# Patient Record
Sex: Female | Born: 1990 | Race: Black or African American | Hispanic: No | Marital: Single | Smoking: Former smoker
Health system: Southern US, Community
[De-identification: ages and names within clinical notes are randomized; demographics above are authoritative.]

## PROBLEM LIST (undated history)

## (undated) DIAGNOSIS — G35 Multiple sclerosis: Secondary | ICD-10-CM

## (undated) DIAGNOSIS — G35D Multiple sclerosis, unspecified: Secondary | ICD-10-CM

## (undated) DIAGNOSIS — F419 Anxiety disorder, unspecified: Secondary | ICD-10-CM

## (undated) DIAGNOSIS — F32A Depression, unspecified: Secondary | ICD-10-CM

## (undated) DIAGNOSIS — B009 Herpesviral infection, unspecified: Secondary | ICD-10-CM

## (undated) HISTORY — PX: APPENDECTOMY: SHX54

## (undated) HISTORY — DX: Anxiety disorder, unspecified: F41.9

## (undated) HISTORY — DX: Depression, unspecified: F32.A

## (undated) HISTORY — DX: Multiple sclerosis: G35

## (undated) HISTORY — DX: Multiple sclerosis, unspecified: G35.D

## (undated) HISTORY — PX: DILATION AND CURETTAGE OF UTERUS: SHX78

---

## 2020-04-27 ENCOUNTER — Inpatient Hospital Stay (HOSPITAL_COMMUNITY)
Admission: EM | Admit: 2020-04-27 | Discharge: 2020-04-30 | DRG: 342 | Disposition: A | Discharge status: Home / Self Care | Payer: No Typology Code available for payment source | Attending: Surgery | Admitting: Surgery

## 2020-04-27 DIAGNOSIS — K651 Peritoneal abscess: Secondary | ICD-10-CM

## 2020-04-27 DIAGNOSIS — K352 Acute appendicitis with generalized peritonitis, without abscess: Secondary | ICD-10-CM | POA: Diagnosis not present

## 2020-04-27 DIAGNOSIS — Z7289 Other problems related to lifestyle: Secondary | ICD-10-CM

## 2020-04-27 DIAGNOSIS — F17210 Nicotine dependence, cigarettes, uncomplicated: Secondary | ICD-10-CM | POA: Diagnosis present

## 2020-04-27 DIAGNOSIS — E860 Dehydration: Secondary | ICD-10-CM | POA: Diagnosis present

## 2020-04-27 DIAGNOSIS — Z20822 Contact with and (suspected) exposure to covid-19: Secondary | ICD-10-CM | POA: Diagnosis present

## 2020-04-27 DIAGNOSIS — N179 Acute kidney failure, unspecified: Secondary | ICD-10-CM | POA: Diagnosis not present

## 2020-04-27 DIAGNOSIS — N739 Female pelvic inflammatory disease, unspecified: Secondary | ICD-10-CM | POA: Diagnosis present

## 2020-04-27 DIAGNOSIS — D62 Acute posthemorrhagic anemia: Secondary | ICD-10-CM | POA: Diagnosis not present

## 2020-04-27 DIAGNOSIS — R1031 Right lower quadrant pain: Secondary | ICD-10-CM

## 2020-04-27 DIAGNOSIS — N7093 Salpingitis and oophoritis, unspecified: Secondary | ICD-10-CM | POA: Diagnosis present

## 2020-04-27 NOTE — ED Triage Notes (Signed)
Pt c/o 10/10 lower abd pain since Friday, denies n/v/d. No fever or chills.

## 2020-04-28 ENCOUNTER — Emergency Department (HOSPITAL_COMMUNITY): Payer: No Typology Code available for payment source

## 2020-04-28 ENCOUNTER — Other Ambulatory Visit: Payer: Self-pay

## 2020-04-28 ENCOUNTER — Encounter (HOSPITAL_COMMUNITY): Admission: EM | Disposition: A | Discharge status: Home / Self Care | Payer: Self-pay | Source: Home / Self Care

## 2020-04-28 ENCOUNTER — Encounter (HOSPITAL_COMMUNITY): Payer: Self-pay | Admitting: Emergency Medicine

## 2020-04-28 ENCOUNTER — Emergency Department (HOSPITAL_COMMUNITY): Payer: No Typology Code available for payment source | Admitting: Certified Registered Nurse Anesthetist

## 2020-04-28 DIAGNOSIS — N7093 Salpingitis and oophoritis, unspecified: Secondary | ICD-10-CM | POA: Diagnosis present

## 2020-04-28 DIAGNOSIS — N179 Acute kidney failure, unspecified: Secondary | ICD-10-CM | POA: Diagnosis present

## 2020-04-28 DIAGNOSIS — Z7289 Other problems related to lifestyle: Secondary | ICD-10-CM | POA: Diagnosis not present

## 2020-04-28 DIAGNOSIS — E860 Dehydration: Secondary | ICD-10-CM | POA: Diagnosis present

## 2020-04-28 DIAGNOSIS — Z20822 Contact with and (suspected) exposure to covid-19: Secondary | ICD-10-CM | POA: Diagnosis present

## 2020-04-28 DIAGNOSIS — K352 Acute appendicitis with generalized peritonitis, without abscess: Secondary | ICD-10-CM | POA: Diagnosis present

## 2020-04-28 DIAGNOSIS — D62 Acute posthemorrhagic anemia: Secondary | ICD-10-CM | POA: Diagnosis not present

## 2020-04-28 DIAGNOSIS — N739 Female pelvic inflammatory disease, unspecified: Secondary | ICD-10-CM | POA: Diagnosis present

## 2020-04-28 DIAGNOSIS — F17210 Nicotine dependence, cigarettes, uncomplicated: Secondary | ICD-10-CM | POA: Diagnosis present

## 2020-04-28 DIAGNOSIS — K651 Peritoneal abscess: Secondary | ICD-10-CM | POA: Diagnosis present

## 2020-04-28 HISTORY — PX: LAPAROSCOPIC APPENDECTOMY: SHX408

## 2020-04-28 LAB — COMPREHENSIVE METABOLIC PANEL
ALT: 8 U/L (ref 0–44)
AST: 13 U/L — ABNORMAL LOW (ref 15–41)
Albumin: 3.2 g/dL — ABNORMAL LOW (ref 3.5–5.0)
Alkaline Phosphatase: 63 U/L (ref 38–126)
Anion gap: 19 — ABNORMAL HIGH (ref 5–15)
BUN: 17 mg/dL (ref 6–20)
CO2: 15 mmol/L — ABNORMAL LOW (ref 22–32)
Calcium: 8.9 mg/dL (ref 8.9–10.3)
Chloride: 102 mmol/L (ref 98–111)
Creatinine, Ser: 3.13 mg/dL — ABNORMAL HIGH (ref 0.44–1.00)
GFR calc Af Amer: 22 mL/min — ABNORMAL LOW (ref >60)
GFR calc non Af Amer: 19 mL/min — ABNORMAL LOW (ref >60)
Glucose, Bld: 118 mg/dL — ABNORMAL HIGH (ref 70–99)
Potassium: 3.6 mmol/L (ref 3.5–5.1)
Sodium: 136 mmol/L (ref 135–145)
Total Bilirubin: 0.2 mg/dL — ABNORMAL LOW (ref 0.3–1.2)
Total Protein: 7.7 g/dL (ref 6.5–8.1)

## 2020-04-28 LAB — CBC
HCT: 37.4 % (ref 36.0–46.0)
Hemoglobin: 13.3 g/dL (ref 12.0–15.0)
MCH: 30.9 pg (ref 26.0–34.0)
MCHC: 35.6 g/dL (ref 30.0–36.0)
MCV: 87 fL (ref 80.0–100.0)
Platelets: 320 10*3/uL (ref 150–400)
RBC: 4.3 MIL/uL (ref 3.87–5.11)
RDW: 14.4 % (ref 11.5–15.5)
WBC: 26.3 10*3/uL — ABNORMAL HIGH (ref 4.0–10.5)
nRBC: 0 % (ref 0.0–0.2)

## 2020-04-28 LAB — URINALYSIS, ROUTINE W REFLEX MICROSCOPIC
Bilirubin Urine: NEGATIVE
Glucose, UA: 50 mg/dL — AB
Ketones, ur: 5 mg/dL — AB
Leukocytes,Ua: NEGATIVE
Nitrite: POSITIVE — AB
Protein, ur: 100 mg/dL — AB
Specific Gravity, Urine: 1.01 (ref 1.005–1.030)
pH: 5 (ref 5.0–8.0)

## 2020-04-28 LAB — POC SARS CORONAVIRUS 2 AG -  ED: SARS Coronavirus 2 Ag: NEGATIVE

## 2020-04-28 LAB — LIPASE, BLOOD: Lipase: 16 U/L (ref 11–51)

## 2020-04-28 LAB — I-STAT BETA HCG BLOOD, ED (MC, WL, AP ONLY): I-stat hCG, quantitative: 6.3 m[IU]/mL — ABNORMAL HIGH (ref <5)

## 2020-04-28 SURGERY — APPENDECTOMY, LAPAROSCOPIC
Anesthesia: General | Site: Abdomen

## 2020-04-28 MED ORDER — 0.9 % SODIUM CHLORIDE (POUR BTL) OPTIME
TOPICAL | Status: DC | PRN
  Administered 2020-04-28: 1000 mL

## 2020-04-28 MED ORDER — DIPHENHYDRAMINE HCL 50 MG/ML IJ SOLN
INTRAMUSCULAR | Status: DC | PRN
Start: 2020-04-28 — End: 2020-04-28
  Administered 2020-04-28: 12.5 mg via INTRAVENOUS

## 2020-04-28 MED ORDER — MIDAZOLAM HCL 2 MG/2ML IJ SOLN
INTRAMUSCULAR | Status: AC
  Filled 2020-04-28: qty 2

## 2020-04-28 MED ORDER — OXYCODONE HCL 5 MG PO TABS
5.0000 mg | ORAL_TABLET | ORAL | Status: DC | PRN
  Administered 2020-04-28: 10 mg via ORAL
  Administered 2020-04-29: 5 mg via ORAL
  Administered 2020-04-29 – 2020-04-30 (×3): 10 mg via ORAL
  Filled 2020-04-28 (×3): qty 2
  Filled 2020-04-28: qty 1
  Filled 2020-04-28: qty 2

## 2020-04-28 MED ORDER — HYDROMORPHONE HCL 1 MG/ML IJ SOLN
0.5000 mg | Freq: Once | INTRAMUSCULAR | Status: AC
  Administered 2020-04-28: 0.5 mg via INTRAVENOUS
  Filled 2020-04-28: qty 1

## 2020-04-28 MED ORDER — FENTANYL CITRATE (PF) 250 MCG/5ML IJ SOLN
INTRAMUSCULAR | Status: AC
  Filled 2020-04-28: qty 5

## 2020-04-28 MED ORDER — SODIUM CHLORIDE 0.9% FLUSH: 3.0000 mL | Freq: Once | INTRAVENOUS | Status: DC

## 2020-04-28 MED ORDER — PHENYLEPHRINE 40 MCG/ML (10ML) SYRINGE FOR IV PUSH (FOR BLOOD PRESSURE SUPPORT)
PREFILLED_SYRINGE | INTRAVENOUS | Status: DC | PRN
  Administered 2020-04-28: 80 ug via INTRAVENOUS

## 2020-04-28 MED ORDER — METOCLOPRAMIDE HCL 5 MG/ML IJ SOLN
INTRAMUSCULAR | Status: AC
  Filled 2020-04-28: qty 2

## 2020-04-28 MED ORDER — PHENYLEPHRINE 40 MCG/ML (10ML) SYRINGE FOR IV PUSH (FOR BLOOD PRESSURE SUPPORT)
PREFILLED_SYRINGE | INTRAVENOUS | Status: AC
  Filled 2020-04-28: qty 10

## 2020-04-28 MED ORDER — SUGAMMADEX SODIUM 200 MG/2ML IV SOLN
INTRAVENOUS | Status: DC | PRN
  Administered 2020-04-28: 200 mg via INTRAVENOUS

## 2020-04-28 MED ORDER — ROCURONIUM BROMIDE 10 MG/ML (PF) SYRINGE
PREFILLED_SYRINGE | INTRAVENOUS | Status: DC | PRN
  Administered 2020-04-28: 50 mg via INTRAVENOUS

## 2020-04-28 MED ORDER — SODIUM CHLORIDE 0.9 % IV SOLN: INTRAVENOUS | Status: DC

## 2020-04-28 MED ORDER — ACETAMINOPHEN 325 MG PO TABS: 650.0000 mg | ORAL_TABLET | Freq: Four times a day (QID) | ORAL | Status: DC | PRN

## 2020-04-28 MED ORDER — DEXAMETHASONE SODIUM PHOSPHATE 10 MG/ML IJ SOLN
INTRAMUSCULAR | Status: DC | PRN
  Administered 2020-04-28: 10 mg via INTRAVENOUS

## 2020-04-28 MED ORDER — PROMETHAZINE HCL 25 MG/ML IJ SOLN: 6.2500 mg | INTRAMUSCULAR | Status: DC | PRN

## 2020-04-28 MED ORDER — DIPHENHYDRAMINE HCL 50 MG/ML IJ SOLN: 25.0000 mg | Freq: Four times a day (QID) | INTRAMUSCULAR | Status: DC | PRN

## 2020-04-28 MED ORDER — LACTATED RINGERS IV BOLUS
1000.0000 mL | Freq: Once | INTRAVENOUS | Status: AC
  Administered 2020-04-28: 1000 mL via INTRAVENOUS

## 2020-04-28 MED ORDER — DIPHENHYDRAMINE HCL 50 MG/ML IJ SOLN
INTRAMUSCULAR | Status: AC
  Filled 2020-04-28: qty 1

## 2020-04-28 MED ORDER — PHENYLEPHRINE HCL-NACL 10-0.9 MG/250ML-% IV SOLN
INTRAVENOUS | Status: DC | PRN
  Administered 2020-04-28: 25 ug/min via INTRAVENOUS

## 2020-04-28 MED ORDER — GABAPENTIN 300 MG PO CAPS
300.0000 mg | ORAL_CAPSULE | Freq: Two times a day (BID) | ORAL | Status: DC
  Administered 2020-04-28 – 2020-04-30 (×5): 300 mg via ORAL
  Filled 2020-04-28 (×5): qty 1

## 2020-04-28 MED ORDER — ONDANSETRON HCL 4 MG/2ML IJ SOLN
INTRAMUSCULAR | Status: AC
  Filled 2020-04-28: qty 2

## 2020-04-28 MED ORDER — ONDANSETRON 4 MG PO TBDP: 4.0000 mg | ORAL_TABLET | Freq: Four times a day (QID) | ORAL | Status: DC | PRN

## 2020-04-28 MED ORDER — SCOPOLAMINE 1 MG/3DAYS TD PT72
MEDICATED_PATCH | TRANSDERMAL | Status: DC | PRN
  Administered 2020-04-28: 1 via TRANSDERMAL

## 2020-04-28 MED ORDER — ONDANSETRON HCL 4 MG/2ML IJ SOLN: 4.0000 mg | Freq: Four times a day (QID) | INTRAMUSCULAR | Status: DC | PRN

## 2020-04-28 MED ORDER — TRAMADOL HCL 50 MG PO TABS
50.0000 mg | ORAL_TABLET | Freq: Four times a day (QID) | ORAL | Status: DC | PRN
  Administered 2020-04-29: 50 mg via ORAL
  Filled 2020-04-28: qty 1

## 2020-04-28 MED ORDER — ALBUMIN HUMAN 5 % IV SOLN
INTRAVENOUS | Status: DC | PRN
Start: 2020-04-28 — End: 2020-04-28

## 2020-04-28 MED ORDER — SCOPOLAMINE 1 MG/3DAYS TD PT72
MEDICATED_PATCH | TRANSDERMAL | Status: AC
  Filled 2020-04-28: qty 1

## 2020-04-28 MED ORDER — ROCURONIUM BROMIDE 10 MG/ML (PF) SYRINGE
PREFILLED_SYRINGE | INTRAVENOUS | Status: AC
  Filled 2020-04-28: qty 10

## 2020-04-28 MED ORDER — BUPIVACAINE HCL (PF) 0.25 % IJ SOLN
INTRAMUSCULAR | Status: AC
  Filled 2020-04-28: qty 30

## 2020-04-28 MED ORDER — LIDOCAINE 2% (20 MG/ML) 5 ML SYRINGE
INTRAMUSCULAR | Status: AC
  Filled 2020-04-28: qty 5

## 2020-04-28 MED ORDER — LIDOCAINE 2% (20 MG/ML) 5 ML SYRINGE
INTRAMUSCULAR | Status: DC | PRN
  Administered 2020-04-28: 60 mg via INTRAVENOUS

## 2020-04-28 MED ORDER — PROPOFOL 10 MG/ML IV BOLUS
INTRAVENOUS | Status: AC
  Filled 2020-04-28: qty 20

## 2020-04-28 MED ORDER — MIDAZOLAM HCL 2 MG/2ML IJ SOLN
INTRAMUSCULAR | Status: DC | PRN
  Administered 2020-04-28: 2 mg via INTRAVENOUS

## 2020-04-28 MED ORDER — OXYCODONE HCL 5 MG PO TABS: 5.0000 mg | ORAL_TABLET | Freq: Once | ORAL | Status: DC | PRN

## 2020-04-28 MED ORDER — FENTANYL CITRATE (PF) 250 MCG/5ML IJ SOLN
INTRAMUSCULAR | Status: DC | PRN
  Administered 2020-04-28 (×3): 50 ug via INTRAVENOUS

## 2020-04-28 MED ORDER — BUPIVACAINE HCL 0.25 % IJ SOLN
INTRAMUSCULAR | Status: DC | PRN
  Administered 2020-04-28: 10 mL

## 2020-04-28 MED ORDER — PIPERACILLIN-TAZOBACTAM 3.375 G IVPB 30 MIN
3.3750 g | Freq: Once | INTRAVENOUS | Status: AC
  Administered 2020-04-28: 3.375 g via INTRAVENOUS
  Filled 2020-04-28: qty 50

## 2020-04-28 MED ORDER — DEXAMETHASONE SODIUM PHOSPHATE 10 MG/ML IJ SOLN
INTRAMUSCULAR | Status: AC
  Filled 2020-04-28: qty 1

## 2020-04-28 MED ORDER — HYDROMORPHONE HCL 1 MG/ML IJ SOLN
1.0000 mg | INTRAMUSCULAR | Status: DC | PRN
  Administered 2020-04-28: 1 mg via INTRAVENOUS
  Filled 2020-04-28 (×2): qty 1

## 2020-04-28 MED ORDER — ENOXAPARIN SODIUM 40 MG/0.4ML ~~LOC~~ SOLN
40.0000 mg | SUBCUTANEOUS | Status: DC
  Administered 2020-04-29 – 2020-04-30 (×2): 40 mg via SUBCUTANEOUS
  Filled 2020-04-28 (×2): qty 0.4

## 2020-04-28 MED ORDER — PIPERACILLIN-TAZOBACTAM 3.375 G IVPB
3.3750 g | Freq: Three times a day (TID) | INTRAVENOUS | Status: DC
  Administered 2020-04-28 – 2020-04-29 (×3): 3.375 g via INTRAVENOUS
  Filled 2020-04-28 (×3): qty 50

## 2020-04-28 MED ORDER — DIPHENHYDRAMINE HCL 25 MG PO CAPS: 25.0000 mg | ORAL_CAPSULE | Freq: Four times a day (QID) | ORAL | Status: DC | PRN

## 2020-04-28 MED ORDER — METOCLOPRAMIDE HCL 5 MG/ML IJ SOLN
INTRAMUSCULAR | Status: DC | PRN
  Administered 2020-04-28: 10 mg via INTRAVENOUS

## 2020-04-28 MED ORDER — ONDANSETRON HCL 4 MG/2ML IJ SOLN
INTRAMUSCULAR | Status: DC | PRN
  Administered 2020-04-28: 4 mg via INTRAVENOUS

## 2020-04-28 MED ORDER — FENTANYL CITRATE (PF) 100 MCG/2ML IJ SOLN: 25.0000 ug | INTRAMUSCULAR | Status: DC | PRN

## 2020-04-28 MED ORDER — ACETAMINOPHEN 650 MG RE SUPP: 650.0000 mg | Freq: Four times a day (QID) | RECTAL | Status: DC | PRN

## 2020-04-28 MED ORDER — PROPOFOL 10 MG/ML IV BOLUS
INTRAVENOUS | Status: DC | PRN
  Administered 2020-04-28: 200 mg via INTRAVENOUS

## 2020-04-28 MED ORDER — LACTATED RINGERS IV SOLN: INTRAVENOUS | Status: DC | PRN

## 2020-04-28 MED ORDER — OXYCODONE HCL 5 MG/5ML PO SOLN: 5.0000 mg | Freq: Once | ORAL | Status: DC | PRN

## 2020-04-28 SURGICAL SUPPLY — 45 items
APPLIER CLIP ROT 10 11.4 M/L (STAPLE)
BENZOIN TINCTURE PRP APPL 2/3 (GAUZE/BANDAGES/DRESSINGS) ×3 IMPLANT
BLADE CLIPPER SURG (BLADE) IMPLANT
CANISTER SUCT 3000ML PPV (MISCELLANEOUS) IMPLANT
CHLORAPREP W/TINT 26 (MISCELLANEOUS) ×3 IMPLANT
CLIP APPLIE ROT 10 11.4 M/L (STAPLE) IMPLANT
CLOSURE WOUND 1/4X4 (GAUZE/BANDAGES/DRESSINGS) ×1
COVER SURGICAL LIGHT HANDLE (MISCELLANEOUS) ×3 IMPLANT
COVER WAND RF STERILE (DRAPES) ×3 IMPLANT
CUTTER FLEX LINEAR 45M (STAPLE) ×3 IMPLANT
DRSG TEGADERM 2-3/8X2-3/4 SM (GAUZE/BANDAGES/DRESSINGS) ×6 IMPLANT
DRSG TEGADERM 4X4.75 (GAUZE/BANDAGES/DRESSINGS) ×3 IMPLANT
ELECT REM PT RETURN 9FT ADLT (ELECTROSURGICAL) ×3
ELECTRODE REM PT RTRN 9FT ADLT (ELECTROSURGICAL) ×1 IMPLANT
ENDOLOOP SUT PDS II  0 18 (SUTURE)
ENDOLOOP SUT PDS II 0 18 (SUTURE) IMPLANT
GAUZE SPONGE 2X2 8PLY STRL LF (GAUZE/BANDAGES/DRESSINGS) ×1 IMPLANT
GLOVE BIO SURGEON STRL SZ7 (GLOVE) ×3 IMPLANT
GLOVE BIOGEL PI IND STRL 7.5 (GLOVE) ×1 IMPLANT
GLOVE BIOGEL PI INDICATOR 7.5 (GLOVE) ×2
GOWN STRL REUS W/ TWL LRG LVL3 (GOWN DISPOSABLE) ×3 IMPLANT
GOWN STRL REUS W/TWL LRG LVL3 (GOWN DISPOSABLE) ×6
KIT BASIN OR (CUSTOM PROCEDURE TRAY) ×3 IMPLANT
KIT TURNOVER KIT B (KITS) ×3 IMPLANT
NS IRRIG 1000ML POUR BTL (IV SOLUTION) ×3 IMPLANT
PAD ARMBOARD 7.5X6 YLW CONV (MISCELLANEOUS) ×6 IMPLANT
POUCH SPECIMEN RETRIEVAL 10MM (ENDOMECHANICALS) ×3 IMPLANT
RELOAD STAPLE TA45 3.5 REG BLU (ENDOMECHANICALS) ×3 IMPLANT
SCISSORS LAP 5X35 DISP (ENDOMECHANICALS) IMPLANT
SET IRRIG TUBING LAPAROSCOPIC (IRRIGATION / IRRIGATOR) ×3 IMPLANT
SET TUBE SMOKE EVAC HIGH FLOW (TUBING) ×3 IMPLANT
SHEARS HARMONIC ACE PLUS 36CM (ENDOMECHANICALS) ×3 IMPLANT
SLEEVE ENDOPATH XCEL 5M (ENDOMECHANICALS) ×3 IMPLANT
SPECIMEN JAR SMALL (MISCELLANEOUS) ×3 IMPLANT
SPONGE GAUZE 2X2 8PLY STER LF (GAUZE/BANDAGES/DRESSINGS) ×1
SPONGE GAUZE 2X2 8PLY STRL LF (GAUZE/BANDAGES/DRESSINGS) ×2 IMPLANT
SPONGE GAUZE 2X2 STER 10/PKG (GAUZE/BANDAGES/DRESSINGS) ×2
STRIP CLOSURE SKIN 1/4X4 (GAUZE/BANDAGES/DRESSINGS) ×2 IMPLANT
SUT MNCRL AB 4-0 PS2 18 (SUTURE) ×3 IMPLANT
TOWEL GREEN STERILE (TOWEL DISPOSABLE) ×3 IMPLANT
TOWEL GREEN STERILE FF (TOWEL DISPOSABLE) ×3 IMPLANT
TRAY LAPAROSCOPIC MC (CUSTOM PROCEDURE TRAY) ×3 IMPLANT
TROCAR XCEL BLUNT TIP 100MML (ENDOMECHANICALS) ×3 IMPLANT
TROCAR XCEL NON-BLD 5MMX100MML (ENDOMECHANICALS) ×3 IMPLANT
WATER STERILE IRR 1000ML POUR (IV SOLUTION) ×3 IMPLANT

## 2020-04-28 NOTE — Op Note (Signed)
Appendectomy, Lap, Procedure Note  Indications: The patient presented with a history of right-sided abdominal pain. She had acute kidney injury, so a non-contrasted CT scan was performed.  A CT scan revealed findings of a mildly dilated appendix.    Pre-operative Diagnosis: Acute appendicitis with generalized peritonitis  Post-operative Diagnosis: Normal appendix; peritonitis; probable ruptured right tubo-ovarian abscess  Surgeon: Wynona Luna   Assistants: None  Anesthesia: General endotracheal anesthesia  ASA Class: 1E  Procedure Details  The patient was seen again in the Holding Room. The risks, benefits, complications, treatment options, and expected outcomes were discussed with the patient and/or family. The possibilities of reaction to medication, perforation of viscus, bleeding, recurrent infection, finding a normal appendix, the need for additional procedures, failure to diagnose a condition, and creating a complication requiring transfusion or operation were discussed. There was concurrence with the proposed plan and informed consent was obtained. The site of surgery was properly noted. The patient was taken to Operating Room, identified as Barbara Barnett and the procedure verified as Appendectomy. A Time Out was held and the above information confirmed.  The patient was placed in the supine position and general anesthesia was induced.  The abdomen was prepped and draped in a sterile fashion. A one centimeter infraumbilical incision was made.  Dissection was carried down to the fascia bluntly.  The fascia was incised vertically.  We entered the peritoneal cavity bluntly.  A pursestring suture was passed around the incision with a 0 Vicryl.  The Hasson cannula was introduced into the abdomen and the tails of the suture were used to hold the Hasson in place.   The pneumoperitoneum was then established maintaining a maximum pressure of 15 mmHg.  Additional 5 mm cannulas then placed in  the left lower quadrant of the abdomen and the right upper quadrant under direct visualization. A careful evaluation of the entire abdomen was carried out. The patient was placed in Trendelenburg and left lateral decubitus position.  There is evidence of purulent fluid and fibrinous exudate throughout the lower abdomen and on the right side of the abdomen.  We suctioned out any free fluid.  The scope was moved to the right upper quadrant port site. The cecum was mobilized medially.  The appendix was identified and appeared grossly normal with no thickening or inflammation.  The appendix was carefully dissected. The appendix was skeletonized with the harmonic scalpel.   The appendix was divided at its base using an endo-GIA stapler. Minimal appendiceal stump was left in place. There was no evidence of bleeding, leakage, or complication after division of the appendix.    I then inspected both ovaries and Fallopian tubes.  The right tube is fairly erythematous and inflamed, but I could not identify any abscess and could not milk out any purulent fluid.  The left tube and ovary appear grossly normal.  We ran the small bowel retrograde from the ileocecal valve and there was no sign of inflammation or perforation from the small bowel.  The Cecum and right colon, as well as the sigmoid colon and rectum, appeared grossly normal.   Irrigation was also performed and irrigate suctioned from the abdomen as well.  The umbilical port site was closed with the purse string suture. There was no residual palpable fascial defect.  The trocar site skin wounds were closed with 4-0 Monocryl.  Instrument, sponge, and needle counts were correct at the conclusion of the case.   Findings: The appendix was found to be grossly normal  with no sign of inflammation or perforation..  Estimated Blood Loss:  Minimal         Drains: none         Specimens: appendix         Complications:  None; patient tolerated the procedure well.          Disposition: PACU - hemodynamically stable.         Condition: stable  Barbara Barnett. Barbara Dover, MD, [facility] Surgery  General/ Trauma Surgery   04/28/2020 11:55 AM

## 2020-04-28 NOTE — Anesthesia Preprocedure Evaluation (Addendum)
Anesthesia Evaluation  Patient identified by MRN, date of birth, ID band Patient awake    Reviewed: Allergy & Precautions, NPO status , Patient's Chart, lab work & pertinent test results  History of Anesthesia Complications Negative for: history of anesthetic complications  Airway Mallampati: II  TM Distance: >3 FB Neck ROM: Full    Dental  (+) Dental Advisory Given, Teeth Intact   Pulmonary Current Smoker and Patient abstained from smoking.,    Pulmonary exam normal        Cardiovascular negative cardio ROS Normal cardiovascular exam     Neuro/Psych negative neurological ROS  negative psych ROS   GI/Hepatic Neg liver ROS,  Appendicitis    Endo/Other  negative endocrine ROS  Renal/GU negative Renal ROS     Musculoskeletal negative musculoskeletal ROS (+)   Abdominal   Peds  Hematology negative hematology ROS (+)   Anesthesia Other Findings Covid neg 5/31   Reproductive/Obstetrics                            Anesthesia Physical Anesthesia Plan  ASA: II  Anesthesia Plan: General   Post-op Pain Management:    Induction: Intravenous  PONV Risk Score and Plan: 4 or greater and Treatment may vary due to age or medical condition, Ondansetron, Scopolamine patch - Pre-op, Midazolam and Dexamethasone  Airway Management Planned: Oral ETT  Additional Equipment: None  Intra-op Plan:   Post-operative Plan: Extubation in OR  Informed Consent: I have reviewed the patients History and Physical, chart, labs and discussed the procedure including the risks, benefits and alternatives for the proposed anesthesia with the patient or authorized representative who has indicated his/her understanding and acceptance.     Dental advisory given  Plan Discussed with: CRNA and Anesthesiologist  Anesthesia Plan Comments:        Anesthesia Quick Evaluation

## 2020-04-28 NOTE — ED Provider Notes (Signed)
Emergency Department Provider Note  I have reviewed the triage vital signs and the nursing notes.  HISTORY  Chief Complaint Abdominal Pain   HPI Barbara Barnett is a 29 y.o. female who presents the emergency department today with abdominal pain.  Patient states that starting Friday night she started have some right lower quadrant suprapubic abdominal pain.  She states that has progressively worsened.  She had a couple episodes of diarrhea but nothing persistent.  No blood in it that she knows seems like it was just watery.  No nausea or vomiting.  No fevers.  She states that the pain is worse with walking worse with bumps worse with any kind of jarring of her abdomen.  No history of the same.  She has been on birth control but she actually took out her IUD recently.   No other associated or modifying symptoms.    History reviewed. No pertinent past medical history.  There are no problems to display for this patient.   History reviewed. No pertinent surgical history.    Allergies Patient has no known allergies.  History reviewed. No pertinent family history.  Social History Social History   Tobacco Use  . Smoking status: Current Every Day Smoker    Packs/day: 0.50    Types: Cigarettes  . Smokeless tobacco: Never Used  Substance Use Topics  . Alcohol use: Yes  . Drug use: Never    Review of Systems  All other systems negative except as documented in the HPI. All pertinent positives and negatives as reviewed in the HPI. ____________________________________________  PHYSICAL EXAM:  VITAL SIGNS: ED Triage Vitals  Enc Vitals Group     BP 04/27/20 2356 126/83     Pulse Rate 04/27/20 2356 78     Resp 04/27/20 2356 16     Temp 04/27/20 2356 99.1 F (37.3 C)     Temp Source 04/27/20 2356 Oral     SpO2 04/27/20 2356 100 %     Weight 04/27/20 2356 128 lb (58.1 kg)     Height 04/27/20 2356 5' (1.524 m)    Constitutional: Alert and oriented. Well appearing and in  no acute distress. Eyes: Conjunctivae are normal. PERRL. EOMI. Head: Atraumatic. Nose: No congestion/rhinnorhea. Mouth/Throat: Mucous membranes are moist.  Oropharynx non-erythematous. Neck: No stridor.  No meningeal signs.   Cardiovascular: Normal rate, regular rhythm. Good peripheral circulation. Grossly normal heart sounds.   Respiratory: Normal respiratory effort.  No retractions. Lungs CTAB. Gastrointestinal: difficult to examine 2/2 level of pain. Has abdominal pain with any movement of stretcher, tenderness diffusely, worse on the right to the point she won't let me examine it fully.   Musculoskeletal: No lower extremity tenderness nor edema. No gross deformities of extremities. Neurologic:  Normal speech and language. No gross focal neurologic deficits are appreciated.  Skin:  Skin is warm, dry and intact. No rash noted.  ____________________________________________   LABS (all labs ordered are listed, but only abnormal results are displayed)  Labs Reviewed  COMPREHENSIVE METABOLIC PANEL - Abnormal; Notable for the following components:      Result Value   CO2 15 (*)    Glucose, Bld 118 (*)    Creatinine, Ser 3.13 (*)    Albumin 3.2 (*)    AST 13 (*)    Total Bilirubin 0.2 (*)    GFR calc non Af Amer 19 (*)    GFR calc Af Amer 22 (*)    Anion gap 19 (*)    All  other components within normal limits  CBC - Abnormal; Notable for the following components:   WBC 26.3 (*)    All other components within normal limits  URINALYSIS, ROUTINE W REFLEX MICROSCOPIC - Abnormal; Notable for the following components:   APPearance HAZY (*)    Glucose, UA 50 (*)    Hgb urine dipstick MODERATE (*)    Ketones, ur 5 (*)    Protein, ur 100 (*)    Nitrite POSITIVE (*)    Bacteria, UA FEW (*)    All other components within normal limits  I-STAT BETA HCG BLOOD, ED ([facility], [facility], [facility] ONLY) - Abnormal; Notable for the following components:   I-stat hCG, quantitative 6.3 (*)    All other components  within normal limits  LIPASE, BLOOD  POC SARS CORONAVIRUS 2 AG -  ED   ____________________________________________  EKG   EKG Interpretation  Date/Time:    Ventricular Rate:    PR Interval:    QRS Duration:   QT Interval:    QTC Calculation:   R Axis:     Text Interpretation:         ____________________________________________  RADIOLOGY  CT ABDOMEN PELVIS WO CONTRAST  Result Date: 04/28/2020 CLINICAL DATA:  Lower abdominal pain. EXAM: CT ABDOMEN AND PELVIS WITHOUT CONTRAST TECHNIQUE: Multidetector CT imaging of the abdomen and pelvis was performed following the standard protocol without IV contrast. COMPARISON:  None. FINDINGS: Lower chest: No significant pulmonary nodules or acute consolidative airspace disease. Hepatobiliary: Normal liver size. No liver mass. Layering mildly dense material in the gallbladder. No gallbladder wall thickening or pericholecystic fluid. No biliary ductal dilatation. Pancreas: Normal, with no mass or duct dilation. Spleen: Normal size. No mass. Adrenals/Urinary Tract: Normal adrenals. No renal stones. No hydronephrosis. No contour deforming renal masses. Normal bladder. Stomach/Bowel: Normal non-distended stomach. Normal caliber small bowel with no small bowel wall thickening. Appendix is mildly dilated (7 mm diameter) and poorly evaluated on this scan performed without IV or oral contrast. Normal large bowel with no diverticulosis, large bowel wall thickening or pericolonic fat stranding. Vascular/Lymphatic: Normal caliber abdominal aorta. No pathologically enlarged lymph nodes in the abdomen or pelvis. Reproductive: Grossly normal uterus.  No adnexal mass. Other: No pneumoperitoneum, ascites or focal fluid collection. Musculoskeletal: No aggressive appearing focal osseous lesions. IMPRESSION: 1. Mildly dilated appendix, poorly evaluated on this scan performed without IV or oral contrast. If there is clinical concern for acute appendicitis, recommend  short-term follow-up CT abdomen/pelvis performed with oral and IV contrast. 2. Layering mildly dense material in the gallbladder, which could represent sludge or small gallstones. No noncontrast CT evidence of acute cholecystitis. No biliary ductal dilatation. Electronically Signed   By: Delbert Phenix M.D.   On: 04/28/2020 05:11   ____________________________________________  PROCEDURES  Procedure(s) performed:   .Critical Care Performed by: Marily Memos, MD Authorized by: Marily Memos, MD   Critical care provider statement:    Critical care time (minutes):  45   Critical care was time spent personally by me on the following activities:  Discussions with consultants, evaluation of patient's response to treatment, examination of patient, ordering and performing treatments and interventions, ordering and review of laboratory studies, ordering and review of radiographic studies, pulse oximetry, re-evaluation of patient's condition, obtaining history from patient or surrogate and review of old charts   ____________________________________________  INITIAL IMPRESSION / ASSESSMENT AND PLAN / ED COURSE   This patient presents to the ED for concern of abdominal pain, this involves an extensive number  of treatment options, and is a complaint that carries with it a high risk of complications and morbidity.  The differential diagnosis includes appendicitis, cystitis/pyelonephritis, PID, ovarian torsion.  Lab Tests:   I Ordered, reviewed, and interpreted labs, which included   CBC, CMP, urinalysis and lipase showing a significantly elevated white blood cell count, elevated creatinine but low normal BUN consistent with likely postobstructive nephropathy.  Medicines ordered:   I ordered medication fluids for her decreased kidney function and dehydration.  Antibiotics with concern for appendicitis and possible complication.  Pain medication.    Imaging Studies ordered:   I independently  visualized and interpreted imaging, CT A/P which showed air-fluid levels throughout the small intestines and colon.  Radiology noted likely dilated appendix but without oral contrast not able to fully evaluate.  With maybe IV contrast secondary to kidney function.  Additional history obtained:   Additional history obtained from no one  Previous records not available  Consultations Obtained:   I consulted Dr. Rosendo Gros with general surgery and discussed lab and imaging findings  Reevaluation:  After the interventions stated above, I reevaluated the patient and found return of pain which once again improved with Dilaudid.  Critical Interventions: Antibiotics, general surgery consultation for likely appendicitis Multiple fluid boluses for AKI  ____________________________________________  FINAL CLINICAL IMPRESSION(S) / ED DIAGNOSES  Final diagnoses:  AKI (acute kidney injury) ([place])  Right lower quadrant abdominal pain    MEDICATIONS GIVEN DURING THIS VISIT:  Medications  sodium chloride flush (NS) 0.9 % injection 3 mL (3 mLs Intravenous Not Given 04/28/20 0201)  HYDROmorphone (DILAUDID) injection 0.5 mg (0.5 mg Intravenous Given 04/28/20 0230)  lactated ringers bolus 1,000 mL (0 mLs Intravenous Stopped 04/28/20 0440)  piperacillin-tazobactam (ZOSYN) IVPB 3.375 g (0 g Intravenous Stopped 04/28/20 0315)  lactated ringers bolus 1,000 mL (1,000 mLs Intravenous New Bag/Given 04/28/20 0555)  HYDROmorphone (DILAUDID) injection 0.5 mg (0.5 mg Intravenous Given 04/28/20 0553)    NEW OUTPATIENT MEDICATIONS STARTED DURING THIS VISIT:  New Prescriptions   No medications on file    Note:  This note was prepared with assistance of Dragon voice recognition software. Occasional wrong-word or sound-a-like substitutions may have occurred due to the inherent limitations of voice recognition software.   Quinlan Vollmer, Corene Cornea, MD 04/28/20 [phone]

## 2020-04-28 NOTE — Plan of Care (Signed)
  Problem: Education: Goal: Knowledge of General Education information will improve Description Including pain rating scale, medication(s)/side effects and non-pharmacologic comfort measures Outcome: Progressing   

## 2020-04-28 NOTE — Anesthesia Procedure Notes (Signed)
Procedure Name: Intubation Date/Time: 04/28/2020 11:01 AM Performed by: Modena Morrow, CRNA Pre-anesthesia Checklist: Patient identified, Emergency Drugs available, Suction available and Patient being monitored Patient Re-evaluated:Patient Re-evaluated prior to induction Oxygen Delivery Method: Circle system utilized Preoxygenation: Pre-oxygenation with 100% oxygen Induction Type: IV induction Ventilation: Mask ventilation without difficulty Laryngoscope Size: Miller and 2 Grade View: Grade I Tube type: Oral Tube size: 7.0 mm Number of attempts: 1 Airway Equipment and Method: Stylet and Oral airway Placement Confirmation: ETT inserted through vocal cords under direct vision,  positive ETCO2 and breath sounds checked- equal and bilateral Secured at: 21 cm Tube secured with: Tape Dental Injury: Teeth and Oropharynx as per pre-operative assessment

## 2020-04-28 NOTE — ED Notes (Signed)
Dr. Ramirez at bedside  

## 2020-04-28 NOTE — Transfer of Care (Signed)
Immediate Anesthesia Transfer of Care Note  Patient: Barbara Barnett  Procedure(s) Performed: APPENDECTOMY LAPAROSCOPIC (N/A Abdomen)  Patient Location: PACU  Anesthesia Type:General  Level of Consciousness: drowsy and patient cooperative  Airway & Oxygen Therapy: Patient Spontanous Breathing and Patient connected to face mask oxygen  Post-op Assessment: Report given to RN and Post -op Vital signs reviewed and stable  Post vital signs: Reviewed and stable  Last Vitals:  Vitals Value Taken Time  BP 98/57   Temp    Pulse 122 04/28/20 1154  Resp 23 04/28/20 1154  SpO2 100 % 04/28/20 1154  Vitals shown include unvalidated device data.  Last Pain:  Vitals:   04/28/20 0745  TempSrc:   PainSc: 10-Worst pain ever         Complications: No apparent anesthesia complications

## 2020-04-28 NOTE — H&P (Signed)
Barbara Barnett is an 29 y.o. female.   Chief Complaint: Abdominal pain HPI: Patient is a 29 year old female, otherwise healthy, who comes in with a 3-day history of right lower quadrant abdominal pain.  Patient states that the pain initially was sporadic, and increased over the weekend.  She states that she had minimal appetite.  She is continue with increased abdominal pain.  She states that she had some diarrhea at home.  Patient states she had no fevers that she knows of.  Patient underwent CT scan laboratory studies per EDP.  CT scan was consistent with dilated appendix, no leukocytosis as well as increased creatinine.  General surgery was consulted for evaluation and management.  History reviewed. No pertinent past medical history.  History reviewed. No pertinent surgical history.  History reviewed. No pertinent family history. Social History:  reports that she has been smoking cigarettes. She has been smoking about 0.50 packs per day. She has never used smokeless tobacco. She reports current alcohol use. She reports that she does not use drugs.  Allergies: No Known Allergies  (Not in a hospital admission)   Results for orders placed or performed during the hospital encounter of 04/27/20 (from the past 48 hour(s))  Lipase, blood     Status: None   Collection Time: 04/28/20 12:01 AM  Result Value Ref Range   Lipase 16 11 - 51 U/L    Comment: Performed at [facility], [address], [place], [place] [address]  Comprehensive metabolic panel     Status: Abnormal   Collection Time: 04/28/20 12:01 AM  Result Value Ref Range   Sodium 136 135 - 145 mmol/L   Potassium 3.6 3.5 - 5.1 mmol/L   Chloride 102 98 - 111 mmol/L   CO2 15 (L) 22 - 32 mmol/L   Glucose, Bld 118 (H) 70 - 99 mg/dL    Comment: Glucose reference range applies only to samples taken after fasting for at least 8 hours.   BUN 17 6 - 20 mg/dL   Creatinine, Ser 3.71 (H) 0.44 - 1.00 mg/dL   Calcium 8.9 8.9 - 06.2  mg/dL   Total Protein 7.7 6.5 - 8.1 g/dL   Albumin 3.2 (L) 3.5 - 5.0 g/dL   AST 13 (L) 15 - 41 U/L   ALT 8 0 - 44 U/L   Alkaline Phosphatase 63 38 - 126 U/L   Total Bilirubin 0.2 (L) 0.3 - 1.2 mg/dL   GFR calc non Af Amer 19 (L) >60 mL/min   GFR calc Af Amer 22 (L) >60 mL/min   Anion gap 19 (H) 5 - 15    Comment: Performed at [facility], [address] [place], [place] [address]  CBC     Status: Abnormal   Collection Time: 04/28/20 12:01 AM  Result Value Ref Range   WBC 26.3 (H) 4.0 - 10.5 K/uL   RBC 4.30 3.87 - 5.11 MIL/uL   Hemoglobin 13.3 12.0 - 15.0 g/dL   HCT 46.2 70.3 - 50.0 %   MCV 87.0 80.0 - 100.0 fL   MCH 30.9 26.0 - 34.0 pg   MCHC 35.6 30.0 - 36.0 g/dL   RDW 93.8 18.2 - 99.3 %   Platelets 320 150 - 400 K/uL   nRBC 0.0 0.0 - 0.2 %    Comment: Performed at [facility], [address], [place], [place] [address]  Urinalysis, Routine w reflex microscopic     Status: Abnormal   Collection Time:  04/28/20 12:01 AM  Result Value Ref Range   Color, Urine YELLOW YELLOW   APPearance HAZY (A) CLEAR   Specific Gravity, Urine 1.010 1.005 - 1.030   pH 5.0 5.0 - 8.0   Glucose, UA 50 (A) NEGATIVE mg/dL   Hgb urine dipstick MODERATE (A) NEGATIVE   Bilirubin Urine NEGATIVE NEGATIVE   Ketones, ur 5 (A) NEGATIVE mg/dL   Protein, ur 100 (A) NEGATIVE mg/dL   Nitrite POSITIVE (A) NEGATIVE   Leukocytes,Ua NEGATIVE NEGATIVE   RBC / HPF 0-5 0 - 5 RBC/hpf   WBC, UA 21-50 0 - 5 WBC/hpf   Bacteria, UA FEW (A) NONE SEEN   Squamous Epithelial / LPF 0-5 0 - 5   Mucus PRESENT     Comment: Performed at [facility], [place] [address], [place],  [address]  I-Stat beta hCG blood, ED     Status: Abnormal   Collection Time: 04/28/20 12:51 AM  Result Value Ref Range   I-stat hCG, quantitative 6.3 (H) <5 mIU/mL   Comment 3            Comment:   GEST. AGE      CONC.  (mIU/mL)   <=1 WEEK        5 - 50     2 WEEKS       50 - 500     3 WEEKS       100 - 10,000     4  WEEKS     1,000 - 30,000        FEMALE AND NON-PREGNANT FEMALE:     LESS THAN 5 mIU/mL    CT ABDOMEN PELVIS WO CONTRAST  Result Date: 04/28/2020 CLINICAL DATA:  Lower abdominal pain. EXAM: CT ABDOMEN AND PELVIS WITHOUT CONTRAST TECHNIQUE: Multidetector CT imaging of the abdomen and pelvis was performed following the standard protocol without IV contrast. COMPARISON:  None. FINDINGS: Lower chest: No significant pulmonary nodules or acute consolidative airspace disease. Hepatobiliary: Normal liver size. No liver mass. Layering mildly dense material in the gallbladder. No gallbladder wall thickening or pericholecystic fluid. No biliary ductal dilatation. Pancreas: Normal, with no mass or duct dilation. Spleen: Normal size. No mass. Adrenals/Urinary Tract: Normal adrenals. No renal stones. No hydronephrosis. No contour deforming renal masses. Normal bladder. Stomach/Bowel: Normal non-distended stomach. Normal caliber small bowel with no small bowel wall thickening. Appendix is mildly dilated (7 mm diameter) and poorly evaluated on this scan performed without IV or oral contrast. Normal large bowel with no diverticulosis, large bowel wall thickening or pericolonic fat stranding. Vascular/Lymphatic: Normal caliber abdominal aorta. No pathologically enlarged lymph nodes in the abdomen or pelvis. Reproductive: Grossly normal uterus.  No adnexal mass. Other: No pneumoperitoneum, ascites or focal fluid collection. Musculoskeletal: No aggressive appearing focal osseous lesions. IMPRESSION: 1. Mildly dilated appendix, poorly evaluated on this scan performed without IV or oral contrast. If there is clinical concern for acute appendicitis, recommend short-term follow-up CT abdomen/pelvis performed with oral and IV contrast. 2. Layering mildly dense material in the gallbladder, which could represent sludge or small gallstones. No noncontrast CT evidence of acute cholecystitis. No biliary ductal dilatation. Electronically  Signed   By: Ilona Sorrel M.D.   On: 04/28/2020 05:11    Review of Systems  Constitutional: Negative for chills and fever.  HENT: Negative for ear discharge, hearing loss and sore throat.   Eyes: Negative for discharge.  Respiratory: Negative for cough and shortness of breath.   Cardiovascular: Negative for chest  pain and leg swelling.  Gastrointestinal: Positive for abdominal pain and diarrhea. Negative for constipation, nausea and vomiting.  Musculoskeletal: Negative for myalgias and neck pain.  Skin: Negative for rash.  Allergic/Immunologic: Negative for environmental allergies.  Neurological: Negative for dizziness and seizures.  Hematological: Does not bruise/bleed easily.  Psychiatric/Behavioral: Negative for suicidal ideas.  All other systems reviewed and are negative.   Blood pressure 107/65, pulse 99, temperature 98.2 F (36.8 C), temperature source Oral, resp. rate (!) 26, height 5' (1.524 m), weight 58.1 kg, SpO2 95 %. Physical Exam  Constitutional: She is oriented to person, place, and time. Vital signs are normal. She appears well-developed and well-nourished.  Conversant No acute distress  HENT:  Head: Normocephalic and atraumatic.  Eyes: Lids are normal. No scleral icterus.  Pupils are equal round and reactive No lid lag Moist conjunctiva  Neck: No tracheal tenderness present. No thyromegaly present.  No cervical lymphadenopathy  Cardiovascular: Normal rate, regular rhythm and intact distal pulses.  No murmur heard. Respiratory: Effort normal and breath sounds normal. She has no wheezes. She has no rales.  GI: Soft. Bowel sounds are normal. She exhibits no distension. There is no hepatosplenomegaly. There is abdominal tenderness (RLQ). There is guarding. There is no rebound. No hernia.  Neurological: She is alert and oriented to person, place, and time.  Normal gait and station  Skin: Skin is warm. No rash noted. No cyanosis. Nails show no clubbing.  Normal skin  turgor  Psychiatric: Judgment normal.  Appropriate affect     Assessment/Plan 29 year old female with acute appendicitis Acute kidney injury secondary to dehydration  1.  Patient be consented for lap appendectomy by Dr. Corliss Skains today. 2. I discussed with the patient the risks benefits of the procedure to include but not limited to: Infection, bleeding, damage to surrounding structures, possible ileus, possible postoperative infection. Patient voiced understanding and wishes to proceed.   Axel Filler, MD 04/28/2020, 6:40 AM

## 2020-04-28 NOTE — ED Notes (Signed)
Consent for CT Abdomen Pelvis WO Contrast obtained by Dr. Clayborne Dana. CT informed pt ready for scan

## 2020-04-28 NOTE — ED Notes (Signed)
Unable to complete bladder scan. Scanner not available. [place] available. Dr. Clayborne Dana informed.

## 2020-04-28 NOTE — Anesthesia Postprocedure Evaluation (Signed)
Anesthesia Post Note  Patient: Barbara Barnett  Procedure(s) Performed: APPENDECTOMY LAPAROSCOPIC (N/A Abdomen)     Patient location during evaluation: PACU Anesthesia Type: General Level of consciousness: awake and alert Pain management: pain level controlled Vital Signs Assessment: post-procedure vital signs reviewed and stable Respiratory status: spontaneous breathing, nonlabored ventilation and respiratory function stable Cardiovascular status: blood pressure returned to baseline, stable and tachycardic Postop Assessment: no apparent nausea or vomiting Anesthetic complications: no    Last Vitals:  Vitals:   04/28/20 1245 04/28/20 1254  BP:  104/73  Pulse:  96  Resp:  (!) 21  Temp: 36.8 C   SpO2:  99%    Last Pain:  Vitals:   04/28/20 1215  TempSrc:   PainSc: 0-No pain                 Beryle Lathe

## 2020-04-28 NOTE — ED Notes (Signed)
Pt transported to CT ?

## 2020-04-29 LAB — BASIC METABOLIC PANEL
Anion gap: 10 (ref 5–15)
BUN: 17 mg/dL (ref 6–20)
CO2: 19 mmol/L — ABNORMAL LOW (ref 22–32)
Calcium: 8.2 mg/dL — ABNORMAL LOW (ref 8.9–10.3)
Chloride: 109 mmol/L (ref 98–111)
Creatinine, Ser: 1.65 mg/dL — ABNORMAL HIGH (ref 0.44–1.00)
GFR calc Af Amer: 48 mL/min — ABNORMAL LOW (ref >60)
GFR calc non Af Amer: 41 mL/min — ABNORMAL LOW (ref >60)
Glucose, Bld: 187 mg/dL — ABNORMAL HIGH (ref 70–99)
Potassium: 3.7 mmol/L (ref 3.5–5.1)
Sodium: 138 mmol/L (ref 135–145)

## 2020-04-29 LAB — CBC
HCT: 28.2 % — ABNORMAL LOW (ref 36.0–46.0)
Hemoglobin: 9.8 g/dL — ABNORMAL LOW (ref 12.0–15.0)
MCH: 30 pg (ref 26.0–34.0)
MCHC: 34.8 g/dL (ref 30.0–36.0)
MCV: 86.2 fL (ref 80.0–100.0)
Platelets: 255 10*3/uL (ref 150–400)
RBC: 3.27 MIL/uL — ABNORMAL LOW (ref 3.87–5.11)
RDW: 14.1 % (ref 11.5–15.5)
WBC: 10.5 10*3/uL (ref 4.0–10.5)
nRBC: 0 % (ref 0.0–0.2)

## 2020-04-29 LAB — RPR: RPR Ser Ql: NONREACTIVE

## 2020-04-29 LAB — HIV ANTIBODY (ROUTINE TESTING W REFLEX): HIV Screen 4th Generation wRfx: NONREACTIVE

## 2020-04-29 MED ORDER — CEFTRIAXONE SODIUM 1 G IJ SOLR
1.0000 g | Freq: Once | INTRAMUSCULAR | Status: AC
  Administered 2020-04-29: 1 g via INTRAMUSCULAR
  Filled 2020-04-29 (×2): qty 10

## 2020-04-29 MED ORDER — DOXYCYCLINE HYCLATE 100 MG PO TABS
100.0000 mg | ORAL_TABLET | Freq: Two times a day (BID) | ORAL | Status: DC
  Administered 2020-04-29 (×2): 100 mg via ORAL
  Filled 2020-04-29 (×2): qty 1

## 2020-04-29 MED ORDER — HYDROMORPHONE HCL 1 MG/ML IJ SOLN: 1.0000 mg | INTRAMUSCULAR | Status: DC | PRN

## 2020-04-29 MED ORDER — METRONIDAZOLE IN NACL 5-0.79 MG/ML-% IV SOLN
500.0000 mg | Freq: Three times a day (TID) | INTRAVENOUS | Status: DC
  Administered 2020-04-29 – 2020-04-30 (×3): 500 mg via INTRAVENOUS
  Filled 2020-04-29 (×3): qty 100

## 2020-04-29 NOTE — Progress Notes (Signed)
1 Day Post-Op  Subjective: CC: Doing well.  She notes some soreness around her incisions.  Still has some right lower quadrant pain that she describes as sharp when she tries to move in bed.  Notes it is controlled with oral medications.  Tolerating regular diet without any nausea or vomiting.  Ambulating in room.  Voiding without difficulty.  Objective: Vital signs in last 24 hours: Temp:  [97 F (36.1 C)-98.3 F (36.8 C)] 98.1 F (36.7 C) (06/01 0529) Pulse Rate:  [51-122] 51 (06/01 0529) Resp:  [16-27] 16 (06/01 0529) BP: (96-117)/(57-81) 103/69 (06/01 0529) SpO2:  [97 %-100 %] 100 % (06/01 0529) Last BM Date: 04/27/20  Intake/Output from previous day: 05/31 0701 - 06/01 0700 In: 4223.2 [P.O.:240; I.V.:2233.2; IV Piggyback:1600] Out: 30 [Blood:30] Intake/Output this shift: Total I/O In: 240 [P.O.:240] Out: -   PE: Gen:  Alert, NAD, pleasant HEENT: EOM's intact, pupils equal and round Pulm: Normal rate and effort  Abd: Soft, ND, appropriately tender around laparoscopic incisions. Some mild tenderness of the RLQ, +BS. Tegaderm dressings in place with gauze that is c/d/i over laparoscopic incisions  Ext:  No erythema, edema, or tenderness BUE/BLE  Psych: A&Ox3  Skin: no rashes noted, warm and dry  Lab Results:  Recent Labs    04/28/20 0001 04/29/20 0257  WBC 26.3* 10.5  HGB 13.3 9.8*  HCT 37.4 28.2*  PLT 320 255   BMET Recent Labs    04/28/20 0001 04/29/20 0257  NA 136 138  K 3.6 3.7  CL 102 109  CO2 15* 19*  GLUCOSE 118* 187*  BUN 17 17  CREATININE 3.13* 1.65*  CALCIUM 8.9 8.2*   PT/INR No results for input(s): LABPROT, INR in the last 72 hours. CMP     Component Value Date/Time   NA 138 04/29/2020 0257   K 3.7 04/29/2020 0257   CL 109 04/29/2020 0257   CO2 19 (L) 04/29/2020 0257   GLUCOSE 187 (H) 04/29/2020 0257   BUN 17 04/29/2020 0257   CREATININE 1.65 (H) 04/29/2020 0257   CALCIUM 8.2 (L) 04/29/2020 0257   PROT 7.7 04/28/2020 0001    ALBUMIN 3.2 (L) 04/28/2020 0001   AST 13 (L) 04/28/2020 0001   ALT 8 04/28/2020 0001   ALKPHOS 63 04/28/2020 0001   BILITOT 0.2 (L) 04/28/2020 0001   GFRNONAA 41 (L) 04/29/2020 0257   GFRAA 48 (L) 04/29/2020 0257   Lipase     Component Value Date/Time   LIPASE 16 04/28/2020 0001       Studies/Results: CT ABDOMEN PELVIS WO CONTRAST  Result Date: 04/28/2020 CLINICAL DATA:  Lower abdominal pain. EXAM: CT ABDOMEN AND PELVIS WITHOUT CONTRAST TECHNIQUE: Multidetector CT imaging of the abdomen and pelvis was performed following the standard protocol without IV contrast. COMPARISON:  None. FINDINGS: Lower chest: No significant pulmonary nodules or acute consolidative airspace disease. Hepatobiliary: Normal liver size. No liver mass. Layering mildly dense material in the gallbladder. No gallbladder wall thickening or pericholecystic fluid. No biliary ductal dilatation. Pancreas: Normal, with no mass or duct dilation. Spleen: Normal size. No mass. Adrenals/Urinary Tract: Normal adrenals. No renal stones. No hydronephrosis. No contour deforming renal masses. Normal bladder. Stomach/Bowel: Normal non-distended stomach. Normal caliber small bowel with no small bowel wall thickening. Appendix is mildly dilated (7 mm diameter) and poorly evaluated on this scan performed without IV or oral contrast. Normal large bowel with no diverticulosis, large bowel wall thickening or pericolonic fat stranding. Vascular/Lymphatic: Normal caliber abdominal aorta. No  pathologically enlarged lymph nodes in the abdomen or pelvis. Reproductive: Grossly normal uterus.  No adnexal mass. Other: No pneumoperitoneum, ascites or focal fluid collection. Musculoskeletal: No aggressive appearing focal osseous lesions. IMPRESSION: 1. Mildly dilated appendix, poorly evaluated on this scan performed without IV or oral contrast. If there is clinical concern for acute appendicitis, recommend short-term follow-up CT abdomen/pelvis performed  with oral and IV contrast. 2. Layering mildly dense material in the gallbladder, which could represent sludge or small gallstones. No noncontrast CT evidence of acute cholecystitis. No biliary ductal dilatation. Electronically Signed   By: Ilona Sorrel M.D.   On: 04/28/2020 05:11    Anti-infectives: Anti-infectives (From admission, onward)   Start     Dose/Rate Route Frequency Ordered Stop   04/28/20 1415  piperacillin-tazobactam (ZOSYN) IVPB 3.375 g     3.375 g 12.5 mL/hr over 240 Minutes Intravenous Every 8 hours 04/28/20 1402     04/28/20 0230  piperacillin-tazobactam (ZOSYN) IVPB 3.375 g     3.375 g 100 mL/hr over 30 Minutes Intravenous  Once 04/28/20 0222 04/28/20 0315       Assessment/Plan ABL Anemia - hgb 9.8 (13.3 on admission). AM labs  AKI - Cr 1.65 (3.13 on admission). AM labs   RLQ Abdominal pain Peritonitis  Possible ruptured right Tubo-ovarian abscess vs PID - Status post laparoscopic appendectomy, Dr. Georgette Dover - 04/28/2020 - POD #1 - Obtained patient consents to test for STIs - Discussed with Pharmacy. Will give  Rocephin IM x 1. Cover with Doxy and Flagyl x 14 days. This will cover for both PID and TOA per Pharmacy  -Patient has a OB/GYN that she can follow-up with at [facility], Dr. Nelva Bush -Will discuss with MD timing of d.c. The patient is voiding well, tolerating diet, ambulating well, pain well controlled, vital signs stable, incisions c/d/i  - Send Urine Cx - AM labs - Mobilize, Pulm Toilet   FEN - Reg VTE - SCDs, Lovenox  ID - Zosyn 5/31 - 6/1. Rocephin IM x 1 6/1. Doxy/Flagyl 6/1 - 6/15. WBC 10.5 from 26.3    LOS: 1 day    Jillyn Ledger , [facility] Surgery 04/29/2020, 9:28 AM Please see Amion for pager number during day hours 7:00am-4:30pm

## 2020-04-29 NOTE — Plan of Care (Signed)
  Problem: Education: Goal: Knowledge of General Education information will improve Description: Including pain rating scale, medication(s)/side effects and non-pharmacologic comfort measures Outcome: Progressing   Problem: Health Behavior/Discharge Planning: Goal: Ability to manage health-related needs will improve Outcome: Progressing   Problem: Clinical Measurements: Goal: Ability to maintain clinical measurements within normal limits will improve Outcome: Progressing Goal: Respiratory complications will improve Outcome: Progressing   Problem: Activity: Goal: Risk for activity intolerance will decrease Outcome: Progressing   Problem: Nutrition: Goal: Adequate nutrition will be maintained Outcome: Progressing   Problem: Coping: Goal: Level of anxiety will decrease Outcome: Progressing   Problem: Pain Managment: Goal: General experience of comfort will improve Outcome: Progressing   Problem: Safety: Goal: Ability to remain free from injury will improve Outcome: Progressing   Problem: Skin Integrity: Goal: Risk for impaired skin integrity will decrease Outcome: Progressing   

## 2020-04-30 LAB — CBC
HCT: 26.1 % — ABNORMAL LOW (ref 36.0–46.0)
Hemoglobin: 8.9 g/dL — ABNORMAL LOW (ref 12.0–15.0)
MCH: 30.3 pg (ref 26.0–34.0)
MCHC: 34.1 g/dL (ref 30.0–36.0)
MCV: 88.8 fL (ref 80.0–100.0)
Platelets: 244 10*3/uL (ref 150–400)
RBC: 2.94 MIL/uL — ABNORMAL LOW (ref 3.87–5.11)
RDW: 14.4 % (ref 11.5–15.5)
WBC: 9.1 10*3/uL (ref 4.0–10.5)
nRBC: 0.3 % — ABNORMAL HIGH (ref 0.0–0.2)

## 2020-04-30 LAB — BASIC METABOLIC PANEL
Anion gap: 9 (ref 5–15)
BUN: 16 mg/dL (ref 6–20)
CO2: 17 mmol/L — ABNORMAL LOW (ref 22–32)
Calcium: 8.7 mg/dL — ABNORMAL LOW (ref 8.9–10.3)
Chloride: 115 mmol/L — ABNORMAL HIGH (ref 98–111)
Creatinine, Ser: 1.22 mg/dL — ABNORMAL HIGH (ref 0.44–1.00)
GFR calc Af Amer: >60 mL/min (ref >60)
GFR calc non Af Amer: 59 mL/min — ABNORMAL LOW (ref >60)
Glucose, Bld: 104 mg/dL — ABNORMAL HIGH (ref 70–99)
Potassium: 3.2 mmol/L — ABNORMAL LOW (ref 3.5–5.1)
Sodium: 141 mmol/L (ref 135–145)

## 2020-04-30 LAB — URINE CULTURE: Culture: NO GROWTH

## 2020-04-30 LAB — SURGICAL PATHOLOGY

## 2020-04-30 MED ORDER — DOXYCYCLINE HYCLATE 100 MG PO TABS: 100.0000 mg | ORAL_TABLET | Freq: Two times a day (BID) | ORAL | 0 refills | Status: DC

## 2020-04-30 MED ORDER — ONDANSETRON 4 MG PO TBDP: 4.0000 mg | ORAL_TABLET | Freq: Four times a day (QID) | ORAL | 0 refills | Status: DC | PRN

## 2020-04-30 MED ORDER — ACETAMINOPHEN 325 MG PO TABS: 650.0000 mg | ORAL_TABLET | Freq: Four times a day (QID) | ORAL | Status: DC | PRN

## 2020-04-30 MED ORDER — METRONIDAZOLE 500 MG PO TABS
500.0000 mg | ORAL_TABLET | Freq: Two times a day (BID) | ORAL | 0 refills | Status: DC
Start: 2020-04-30 — End: 2020-11-09

## 2020-04-30 MED ORDER — POTASSIUM CHLORIDE CRYS ER 20 MEQ PO TBCR
40.0000 meq | EXTENDED_RELEASE_TABLET | Freq: Two times a day (BID) | ORAL | Status: AC
  Administered 2020-04-30: 40 meq via ORAL
  Filled 2020-04-30: qty 2

## 2020-04-30 MED ORDER — OXYCODONE HCL 5 MG PO TABS: 5.0000 mg | ORAL_TABLET | Freq: Four times a day (QID) | ORAL | 0 refills | Status: DC | PRN

## 2020-04-30 NOTE — Discharge Summary (Signed)
Patient ID: Barbara Barnett [id number] 1991-09-16 29 y.o.  Admit date: 04/27/2020 Discharge date: 04/30/2020  Admitting Diagnosis: 29 year old female with acute appendicitis Acute kidney injury secondary to dehydration  Discharge Diagnosis RLQ Abdominal pain Peritonitis  Possible Ruptured Right Tubo-ovarian abscess vs PID ABL Anemia  AKI   Consultants None  H&P: Patient is a 29 year old female, otherwise healthy, who comes in with a 3-day history of right lower quadrant abdominal pain.  Patient states that the pain initially was sporadic, and increased over the weekend.  She states that she had minimal appetite.  She is continue with increased abdominal pain.  She states that she had some diarrhea at home.  Patient states she had no fevers that she knows of.  Patient underwent CT scan laboratory studies per EDP.  CT scan was consistent with dilated appendix, no leukocytosis as well as increased creatinine.  General surgery was consulted for evaluation and management.  Procedures Dr. Corliss Skains - Laparoscopic Appendectomy - 04/28/2020  Hospital Course:  Patient was admitted to the general surgery service for suspected appendicitis. Patient was taken to the OR on 5/31 as above. Intraoperatively, patient was found to have normal appendix, peritonitis and possible ruptured right TOA. Patient underwent appendectomy and tolerated the procedure well. Patient was transferred back to the floor. Abx were changed to cover for PID vs TOA. STI testing was sent with patients consent. Patients AKI improved. On POD2, the patient was voiding well, tolerating diet, ambulating well, pain well controlled, vital signs stable, incisions c/d/i and felt stable for discharge home. The patient is to follow up with our office and OBGYN. She was sent home with below abx.   Physical Exam: Gen:  Alert, NAD, pleasant HEENT: EOM's intact, pupils equal and round Pulm: Normal rate and effort  Abd: Soft, ND,  appropriately tender around laparoscopic incisions. Some mild tenderness of the RLQ, +BS. Tegaderm removed. Steristrips in place and incisions c/d/i over laparoscopic incisions  Ext:  No erythema, edema, or tenderness BUE/BLE  Psych: A&Ox3  Skin: no rashes noted, warm and dry  Allergies as of 04/30/2020   No Known Allergies     Medication List    TAKE these medications   acetaminophen 325 MG tablet Commonly known as: TYLENOL Take 2 tablets (650 mg total) by mouth every 6 (six) hours as needed.   doxycycline 100 MG tablet Commonly known as: VIBRA-TABS Take 1 tablet (100 mg total) by mouth 2 (two) times daily.   metroNIDAZOLE 500 MG tablet Commonly known as: Flagyl Take 1 tablet (500 mg total) by mouth 2 (two) times daily with a meal. DO NOT CONSUME ALCOHOL WHILE TAKING THIS MEDICATION.   ondansetron 4 MG disintegrating tablet Commonly known as: ZOFRAN-ODT Take 1 tablet (4 mg total) by mouth every 6 (six) hours as needed for nausea.   oxyCODONE 5 MG immediate release tablet Commonly known as: Oxy IR/ROXICODONE Take 1 tablet (5 mg total) by mouth every 6 (six) hours as needed for breakthrough pain.        Follow-up Information    Magda Kiel, MD. Schedule an appointment as soon as possible for a visit.   Specialty: Obstetrics and Gynecology Why: For follow up Contact information: [address] [place] [address] [phone]        [facility] Follow up.   Why: Call to make an outpatient with your OBGYN or with women's outpatient clinic Contact information: [address] [place] [address], [address] [id number] [place]  [phone]       Surgery, [place]. Go on 05/20/2020.   Specialty: General Surgery Why: @ 9am. Please arrive to your appointment 30 minutes early for paperwork. Please bring a copy of your photo ID and insurance card.  Contact information: [address] [place]   [phone]           Signed: Alferd Apa, [facility] Surgery 04/30/2020, 8:42 AM Please see Amion for pager number during day hours 7:00am-4:30pm

## 2020-04-30 NOTE — Discharge Instructions (Signed)
Please take all of your antibiotics until finished! You may easily sunburn while taking Doxycycline. Please wear sunscreen. Please do not drink alcohol while taking flagyl. Your STI (for Gonorrhea/Chlamydia) tests are still pending. You can log on to MyChart to see these results when they conclude. Please remain sexually abstinent until you complete your course of antibiotics, your test results return and you follow up with your OBGYN. If any of your STI tests come back positive, please notify your partner to get tested.   [facility]  Please arrive at least 30 min before your appointment to complete your check in paperwork.  If you are unable to arrive 30 min prior to your appointment time we may have to cancel or reschedule you. LAPAROSCOPIC SURGERY: POST OP INSTRUCTIONS Always review your discharge instruction sheet given to you by the facility where your surgery was performed. IF YOU HAVE DISABILITY OR FAMILY LEAVE FORMS, YOU MUST BRING THEM TO THE OFFICE FOR PROCESSING.   DO NOT GIVE THEM TO YOUR DOCTOR.  PAIN CONTROL  1. First take acetaminophen (Tylenol) AND/or ibuprofen (Advil) to control your pain after surgery.  Follow directions on package.  Taking acetaminophen (Tylenol) and/or ibuprofen (Advil) regularly after surgery will help to control your pain and lower the amount of prescription pain medication you may need.  You should not take more than 4,000 mg (4 grams) of acetaminophen (Tylenol) in 24 hours.  You should not take ibuprofen (Advil), aleve, motrin, naprosyn or other NSAIDS if you have a history of stomach ulcers or chronic kidney disease.  2. A prescription for pain medication may be given to you upon discharge.  Take your pain medication as prescribed, if you still have uncontrolled pain after taking acetaminophen (Tylenol) or ibuprofen (Advil). 3. Use ice packs to help control pain. 4. If you need a refill on your pain medication, please contact your  pharmacy.  They will contact our office to request authorization. Prescriptions will not be filled after 5pm or on week-ends.  HOME MEDICATIONS 5. Take your usually prescribed medications unless otherwise directed.  DIET 6. You should follow a light diet the first few days after arrival home.  Be sure to include lots of fluids daily. Avoid fatty, fried foods.   CONSTIPATION 7. It is common to experience some constipation after surgery and if you are taking pain medication.  Increasing fluid intake and taking a stool softener (such as Colace) will usually help or prevent this problem from occurring.  A mild laxative (Milk of Magnesia or Miralax) should be taken according to package instructions if there are no bowel movements after 48 hours.  WOUND/INCISION CARE 8. Most patients will experience some swelling and bruising in the area of the incisions.  Ice packs will help.  Swelling and bruising can take several days to resolve.  9. Unless discharge instructions indicate otherwise, follow guidelines below  a. STERI-STRIPS - you may remove your outer bandages 48 hours after surgery, and you may shower at that time.  You have steri-strips (small skin tapes) in place directly over the incision.  These strips should be left on the skin for 7-10 days.   b. DERMABOND/SKIN GLUE - you may shower in 24 hours.  The glue will flake off over the next 2-3 weeks. 10. Any sutures or staples will be removed at the office during your follow-up visit.  ACTIVITIES 11. You may resume regular (light) daily activities beginning the next day--such as daily self-care, walking, climbing stairs--gradually  increasing activities as tolerated.  You may have sexual intercourse when it is comfortable.  Refrain from any heavy lifting or straining until approved by your doctor. a. You may drive when you are no longer taking prescription pain medication, you can comfortably wear a seatbelt, and you can safely maneuver your car and  apply brakes.  FOLLOW-UP 12. You should see your doctor in the office for a follow-up appointment approximately 2-3 weeks after your surgery.  You should have been given your post-op/follow-up appointment when your surgery was scheduled.  If you did not receive a post-op/follow-up appointment, make sure that you call for this appointment within a day or two after you arrive home to insure a convenient appointment time.   WHEN TO CALL YOUR DOCTOR: 1. Fever over 101.0 2. Inability to urinate 3. Continued bleeding from incision. 4. Increased pain, redness, or drainage from the incision. 5. Increasing abdominal pain  The clinic staff is available to answer your questions during regular business hours.  Please don't hesitate to call and ask to speak to one of the nurses for clinical concerns.  If you have a medical emergency, go to the nearest emergency room or call 911.  A surgeon from [facility] is always on call at the hospital. [address], [place], [place]  [address] ? [address] [address], [place], [place]   [address] [phone] ? [phone] ? FAX [phone]  ........Marland Kitchen   Managing Your Pain After Surgery Without Opioids    Thank you for participating in our program to help patients manage their pain after surgery without opioids. This is part of our effort to provide you with the best care possible, without exposing you or your family to the risk that opioids pose.  What pain can I expect after surgery? You can expect to have some pain after surgery. This is normal. The pain is typically worse the day after surgery, and quickly begins to get better. Many studies have found that many patients are able to manage their pain after surgery with Over-the-Counter (OTC) medications such as Tylenol and Motrin. If you have a condition that does not allow you to take Tylenol or Motrin, notify your surgical team.  How will I manage my pain? The best strategy for  controlling your pain after surgery is around the clock pain control with Tylenol (acetaminophen) and Motrin (ibuprofen or Advil). Alternating these medications with each other allows you to maximize your pain control. In addition to Tylenol and Motrin, you can use heating pads or ice packs on your incisions to help reduce your pain.  How will I alternate your regular strength over-the-counter pain medication? You will take a dose of pain medication every three hours. ; Start by taking 650 mg of Tylenol (2 pills of 325 mg) ; 3 hours later take 600 mg of Motrin (3 pills of 200 mg) ; 3 hours after taking the Motrin take 650 mg of Tylenol ; 3 hours after that take 600 mg of Motrin.   - 1 -  See example - if your first dose of Tylenol is at 12:00 PM   12:00 PM Tylenol 650 mg (2 pills of 325 mg)  3:00 PM Motrin 600 mg (3 pills of 200 mg)  6:00 PM Tylenol 650 mg (2 pills of 325 mg)  9:00 PM Motrin 600 mg (3 pills of 200 mg)  Continue alternating every 3 hours   We recommend that you follow this schedule around-the-clock for at  least 3 days after surgery, or until you feel that it is no longer needed. Use the table on the last page of this handout to keep track of the medications you are taking. Important: Do not take more than 3000mg  of Tylenol or 3200mg  of Motrin in a 24-hour period. Do not take ibuprofen/Motrin if you have a history of bleeding stomach ulcers, severe kidney disease, &/or actively taking a blood thinner  What if I still have pain? If you have pain that is not controlled with the over-the-counter pain medications (Tylenol and Motrin or Advil) you might have what we call "breakthrough" pain. You will receive a prescription for a small amount of an opioid pain medication such as Oxycodone, Tramadol, or Tylenol with Codeine. Use these opioid pills in the first 24 hours after surgery if you have breakthrough pain. Do not take more than 1 pill every 4-6 hours.  If you still have  uncontrolled pain after using all opioid pills, don't hesitate to call our staff using the number provided. We will help make sure you are managing your pain in the best way possible, and if necessary, we can provide a prescription for additional pain medication.   Day 1    Time  Name of Medication Number of pills taken  Amount of Acetaminophen  Pain Level   Comments  AM PM       AM PM       AM PM       AM PM       AM PM       AM PM       AM PM       AM PM       Total Daily amount of Acetaminophen Do not take more than  3,000 mg per day      Day 2    Time  Name of Medication Number of pills taken  Amount of Acetaminophen  Pain Level   Comments  AM PM       AM PM       AM PM       AM PM       AM PM       AM PM       AM PM       AM PM       Total Daily amount of Acetaminophen Do not take more than  3,000 mg per day      Day 3    Time  Name of Medication Number of pills taken  Amount of Acetaminophen  Pain Level   Comments  AM PM       AM PM       AM PM       AM PM          AM PM       AM PM       AM PM       AM PM       Total Daily amount of Acetaminophen Do not take more than  3,000 mg per day      Day 4    Time  Name of Medication Number of pills taken  Amount of Acetaminophen  Pain Level   Comments  AM PM       AM PM       AM PM       AM PM       AM PM  AM PM       AM PM       AM PM       Total Daily amount of Acetaminophen Do not take more than  3,000 mg per day      Day 5    Time  Name of Medication Number of pills taken  Amount of Acetaminophen  Pain Level   Comments  AM PM       AM PM       AM PM       AM PM       AM PM       AM PM       AM PM       AM PM       Total Daily amount of Acetaminophen Do not take more than  3,000 mg per day       Day 6    Time  Name of Medication Number of pills taken  Amount of Acetaminophen  Pain Level  Comments  AM PM       AM PM       AM PM       AM PM        AM PM       AM PM       AM PM       AM PM       Total Daily amount of Acetaminophen Do not take more than  3,000 mg per day      Day 7    Time  Name of Medication Number of pills taken  Amount of Acetaminophen  Pain Level   Comments  AM PM       AM PM       AM PM       AM PM       AM PM       AM PM       AM PM       AM PM       Total Daily amount of Acetaminophen Do not take more than  3,000 mg per day        For additional information about how and where to safely dispose of unused opioid medications - [employer]  Disclaimer: This document contains information and/or instructional materials adapted from [facility] for the typical patient with your condition. It does not replace medical advice from your health care provider because your experience may differ from that of the typical patient. Talk to your health care provider if you have any questions about this document, your condition or your treatment plan. Adapted from [place]

## 2020-04-30 NOTE — Progress Notes (Signed)
AVS given and reviewed with pt. Medications discussed. Work note from [employer], [place] provided to pt. All questions answered to satisfaction. Pt verbalized understanding of information given. Pt to be escorted off the unit with all belongings via wheelchair by volunteer services.

## 2020-04-30 NOTE — Plan of Care (Signed)
  Problem: Education: Goal: Knowledge of General Education information will improve Description: Including pain rating scale, medication(s)/side effects and non-pharmacologic comfort measures Outcome: Progressing   Problem: Health Behavior/Discharge Planning: Goal: Ability to manage health-related needs will improve Outcome: Progressing   Problem: Clinical Measurements: Goal: Ability to maintain clinical measurements within normal limits will improve Outcome: Progressing Goal: Respiratory complications will improve Outcome: Progressing   Problem: Activity: Goal: Risk for activity intolerance will decrease Outcome: Progressing   Problem: Nutrition: Goal: Adequate nutrition will be maintained Outcome: Progressing   Problem: Elimination: Goal: Will not experience complications related to bowel motility Outcome: Progressing Goal: Will not experience complications related to urinary retention Outcome: Progressing   Problem: Pain Managment: Goal: General experience of comfort will improve Outcome: Progressing   Problem: Safety: Goal: Ability to remain free from injury will improve Outcome: Progressing   Problem: Skin Integrity: Goal: Risk for impaired skin integrity will decrease Outcome: Progressing   

## 2020-05-01 LAB — GC/CHLAMYDIA PROBE AMP (~~LOC~~) NOT AT ARMC
Chlamydia: NEGATIVE
Comment: NEGATIVE
Comment: NORMAL
Neisseria Gonorrhea: POSITIVE — AB

## 2020-05-03 ENCOUNTER — Other Ambulatory Visit: Payer: Self-pay

## 2020-05-03 ENCOUNTER — Emergency Department (HOSPITAL_COMMUNITY): Payer: No Typology Code available for payment source

## 2020-05-03 ENCOUNTER — Encounter (HOSPITAL_COMMUNITY): Payer: Self-pay

## 2020-05-03 ENCOUNTER — Inpatient Hospital Stay (HOSPITAL_COMMUNITY)
Admission: EM | Admit: 2020-05-03 | Discharge: 2020-05-06 | DRG: 641 | Disposition: A | Discharge status: Home / Self Care | Payer: No Typology Code available for payment source | Attending: Family Medicine | Admitting: Family Medicine

## 2020-05-03 DIAGNOSIS — R1031 Right lower quadrant pain: Secondary | ICD-10-CM | POA: Diagnosis not present

## 2020-05-03 DIAGNOSIS — F17210 Nicotine dependence, cigarettes, uncomplicated: Secondary | ICD-10-CM | POA: Diagnosis present

## 2020-05-03 DIAGNOSIS — N730 Acute parametritis and pelvic cellulitis: Secondary | ICD-10-CM

## 2020-05-03 DIAGNOSIS — N7093 Salpingitis and oophoritis, unspecified: Secondary | ICD-10-CM | POA: Diagnosis present

## 2020-05-03 DIAGNOSIS — D72829 Elevated white blood cell count, unspecified: Secondary | ICD-10-CM

## 2020-05-03 DIAGNOSIS — Z20822 Contact with and (suspected) exposure to covid-19: Secondary | ICD-10-CM | POA: Diagnosis present

## 2020-05-03 DIAGNOSIS — R0602 Shortness of breath: Secondary | ICD-10-CM

## 2020-05-03 DIAGNOSIS — E876 Hypokalemia: Principal | ICD-10-CM | POA: Diagnosis present

## 2020-05-03 LAB — URINALYSIS, ROUTINE W REFLEX MICROSCOPIC
Bacteria, UA: NONE SEEN
Bilirubin Urine: NEGATIVE
Glucose, UA: NEGATIVE mg/dL
Hgb urine dipstick: NEGATIVE
Ketones, ur: NEGATIVE mg/dL
Nitrite: NEGATIVE
Protein, ur: NEGATIVE mg/dL
Specific Gravity, Urine: 1.017 (ref 1.005–1.030)
pH: 5 (ref 5.0–8.0)

## 2020-05-03 LAB — CREATININE, SERUM
Creatinine, Ser: 0.86 mg/dL (ref 0.44–1.00)
GFR calc Af Amer: >60 mL/min (ref >60)
GFR calc non Af Amer: >60 mL/min (ref >60)

## 2020-05-03 LAB — COMPREHENSIVE METABOLIC PANEL
ALT: 16 U/L (ref 0–44)
AST: 11 U/L — ABNORMAL LOW (ref 15–41)
Albumin: 2.6 g/dL — ABNORMAL LOW (ref 3.5–5.0)
Alkaline Phosphatase: 44 U/L (ref 38–126)
Anion gap: 11 (ref 5–15)
BUN: 7 mg/dL (ref 6–20)
CO2: 23 mmol/L (ref 22–32)
Calcium: 8.5 mg/dL — ABNORMAL LOW (ref 8.9–10.3)
Chloride: 107 mmol/L (ref 98–111)
Creatinine, Ser: 1.03 mg/dL — ABNORMAL HIGH (ref 0.44–1.00)
GFR calc Af Amer: >60 mL/min (ref >60)
GFR calc non Af Amer: >60 mL/min (ref >60)
Glucose, Bld: 102 mg/dL — ABNORMAL HIGH (ref 70–99)
Potassium: 2.8 mmol/L — ABNORMAL LOW (ref 3.5–5.1)
Sodium: 141 mmol/L (ref 135–145)
Total Bilirubin: <0.1 mg/dL — ABNORMAL LOW (ref 0.3–1.2)
Total Protein: 5.4 g/dL — ABNORMAL LOW (ref 6.5–8.1)

## 2020-05-03 LAB — LIPASE, BLOOD: Lipase: 38 U/L (ref 11–51)

## 2020-05-03 LAB — CBC
HCT: 31.4 % — ABNORMAL LOW (ref 36.0–46.0)
HCT: 29.7 % — ABNORMAL LOW (ref 36.0–46.0)
Hemoglobin: 10.9 g/dL — ABNORMAL LOW (ref 12.0–15.0)
Hemoglobin: 10.4 g/dL — ABNORMAL LOW (ref 12.0–15.0)
MCH: 30.5 pg (ref 26.0–34.0)
MCH: 30.2 pg (ref 26.0–34.0)
MCHC: 34.7 g/dL (ref 30.0–36.0)
MCHC: 35 g/dL (ref 30.0–36.0)
MCV: 88 fL (ref 80.0–100.0)
MCV: 86.3 fL (ref 80.0–100.0)
Platelets: 472 10*3/uL — ABNORMAL HIGH (ref 150–400)
Platelets: 456 10*3/uL — ABNORMAL HIGH (ref 150–400)
RBC: 3.57 MIL/uL — ABNORMAL LOW (ref 3.87–5.11)
RBC: 3.44 MIL/uL — ABNORMAL LOW (ref 3.87–5.11)
RDW: 14.6 % (ref 11.5–15.5)
RDW: 14.5 % (ref 11.5–15.5)
WBC: 10.9 10*3/uL — ABNORMAL HIGH (ref 4.0–10.5)
WBC: 9 10*3/uL (ref 4.0–10.5)
nRBC: 0.2 % (ref 0.0–0.2)
nRBC: 0 % (ref 0.0–0.2)

## 2020-05-03 LAB — WET PREP, GENITAL
Clue Cells Wet Prep HPF POC: NONE SEEN
Sperm: NONE SEEN
Trich, Wet Prep: NONE SEEN
Yeast Wet Prep HPF POC: NONE SEEN

## 2020-05-03 LAB — SARS CORONAVIRUS 2 BY RT PCR (HOSPITAL ORDER, PERFORMED IN ~~LOC~~ HOSPITAL LAB): SARS Coronavirus 2: NEGATIVE

## 2020-05-03 LAB — I-STAT BETA HCG BLOOD, ED (MC, WL, AP ONLY): I-stat hCG, quantitative: <5 m[IU]/mL (ref <5)

## 2020-05-03 MED ORDER — SODIUM CHLORIDE 0.9% FLUSH
3.0000 mL | Freq: Once | INTRAVENOUS | Status: AC
  Administered 2020-05-03: 3 mL via INTRAVENOUS

## 2020-05-03 MED ORDER — SODIUM CHLORIDE 0.9 % IV BOLUS
1000.0000 mL | Freq: Once | INTRAVENOUS | Status: AC
  Administered 2020-05-03: 1000 mL via INTRAVENOUS

## 2020-05-03 MED ORDER — IOHEXOL 300 MG/ML  SOLN
100.0000 mL | Freq: Once | INTRAMUSCULAR | Status: AC | PRN
  Administered 2020-05-03: 100 mL via INTRAVENOUS

## 2020-05-03 MED ORDER — ENOXAPARIN SODIUM 40 MG/0.4ML ~~LOC~~ SOLN
40.0000 mg | SUBCUTANEOUS | Status: DC
  Administered 2020-05-03 – 2020-05-05 (×3): 40 mg via SUBCUTANEOUS
  Filled 2020-05-03 (×3): qty 0.4

## 2020-05-03 MED ORDER — METRONIDAZOLE IN NACL 5-0.79 MG/ML-% IV SOLN: 500.0000 mg | Freq: Once | INTRAVENOUS | Status: DC

## 2020-05-03 MED ORDER — CEFTRIAXONE SODIUM 500 MG IJ SOLR: 500.0000 mg | Freq: Once | INTRAMUSCULAR | Status: DC

## 2020-05-03 MED ORDER — SODIUM CHLORIDE 0.9 % IV SOLN: INTRAVENOUS | Status: DC

## 2020-05-03 MED ORDER — POTASSIUM CHLORIDE 10 MEQ/100ML IV SOLN
10.0000 meq | INTRAVENOUS | Status: AC
  Administered 2020-05-03 (×2): 10 meq via INTRAVENOUS
  Filled 2020-05-03 (×2): qty 100

## 2020-05-03 MED ORDER — DOXYCYCLINE HYCLATE 100 MG PO TABS
100.0000 mg | ORAL_TABLET | Freq: Two times a day (BID) | ORAL | Status: DC
  Administered 2020-05-03 – 2020-05-06 (×6): 100 mg via ORAL
  Filled 2020-05-03 (×6): qty 1

## 2020-05-03 MED ORDER — METRONIDAZOLE IN NACL 5-0.79 MG/ML-% IV SOLN: 500.0000 mg | Freq: Four times a day (QID) | INTRAVENOUS | Status: DC

## 2020-05-03 MED ORDER — ONDANSETRON HCL 4 MG/2ML IJ SOLN
4.0000 mg | Freq: Once | INTRAMUSCULAR | Status: AC
  Administered 2020-05-03: 4 mg via INTRAVENOUS
  Filled 2020-05-03: qty 2

## 2020-05-03 MED ORDER — MORPHINE SULFATE (PF) 4 MG/ML IV SOLN
4.0000 mg | Freq: Once | INTRAVENOUS | Status: AC
  Administered 2020-05-03: 4 mg via INTRAVENOUS
  Filled 2020-05-03: qty 1

## 2020-05-03 MED ORDER — MENTHOL 3 MG MT LOZG: 1.0000 | LOZENGE | OROMUCOSAL | Status: DC | PRN

## 2020-05-03 MED ORDER — SODIUM CHLORIDE 0.9 % IV SOLN
100.0000 mg | Freq: Once | INTRAVENOUS | Status: DC
  Administered 2020-05-03: 100 mg via INTRAVENOUS
  Filled 2020-05-03: qty 100

## 2020-05-03 MED ORDER — SODIUM CHLORIDE 0.9 % IV SOLN
2.0000 g | Freq: Four times a day (QID) | INTRAVENOUS | Status: DC
  Administered 2020-05-03 – 2020-05-06 (×11): 2 g via INTRAVENOUS
  Filled 2020-05-03 (×14): qty 2

## 2020-05-03 MED ORDER — ONDANSETRON HCL 4 MG/2ML IJ SOLN
4.0000 mg | Freq: Four times a day (QID) | INTRAMUSCULAR | Status: DC | PRN
  Administered 2020-05-04: 4 mg via INTRAVENOUS
  Filled 2020-05-03: qty 2

## 2020-05-03 MED ORDER — ALUM & MAG HYDROXIDE-SIMETH 200-200-20 MG/5ML PO SUSP: 30.0000 mL | ORAL | Status: DC | PRN

## 2020-05-03 MED ORDER — ONDANSETRON HCL 4 MG PO TABS: 4.0000 mg | ORAL_TABLET | Freq: Four times a day (QID) | ORAL | Status: DC | PRN

## 2020-05-03 MED ORDER — HYDROMORPHONE HCL 1 MG/ML IJ SOLN
INTRAMUSCULAR | Status: AC
  Administered 2020-05-03: 0.6 mg
  Filled 2020-05-03: qty 1

## 2020-05-03 MED ORDER — IBUPROFEN 600 MG PO TABS
600.0000 mg | ORAL_TABLET | Freq: Four times a day (QID) | ORAL | Status: DC | PRN
  Filled 2020-05-03: qty 1

## 2020-05-03 MED ORDER — GUAIFENESIN 100 MG/5ML PO SOLN
15.0000 mL | ORAL | Status: DC | PRN
  Filled 2020-05-03: qty 15

## 2020-05-03 MED ORDER — OXYCODONE-ACETAMINOPHEN 5-325 MG PO TABS
1.0000 | ORAL_TABLET | ORAL | Status: DC | PRN
  Administered 2020-05-03 (×2): 2 via ORAL
  Administered 2020-05-04: 1 via ORAL
  Administered 2020-05-04: 2 via ORAL
  Administered 2020-05-04 – 2020-05-06 (×5): 1 via ORAL
  Filled 2020-05-03 (×2): qty 1
  Filled 2020-05-03 (×3): qty 2
  Filled 2020-05-03 (×4): qty 1

## 2020-05-03 MED ORDER — HYDROMORPHONE HCL 1 MG/ML IJ SOLN: 0.2000 mg | INTRAMUSCULAR | Status: DC | PRN

## 2020-05-03 MED ORDER — HYDROMORPHONE HCL 1 MG/ML IJ SOLN
1.0000 mg | Freq: Once | INTRAMUSCULAR | Status: AC
  Administered 2020-05-03: 1 mg via INTRAVENOUS
  Filled 2020-05-03: qty 1

## 2020-05-03 NOTE — ED Provider Notes (Signed)
[facility] EMERGENCY DEPARTMENT Provider Note   CSN: [id number] Arrival date & time: 05/03/20  6160    History RLQ pain  Barbara Barnett is a 29 y.o. female with past medical history significant for recent lap appendectomy 04/28/20 who presents for evaluation of emesis and pain.  Has been taking home Norco without relief.  Rates her pain a 10/10.  Pain feels similar to when she had appendicitis 5 days ago.  She is currently on her menstrual cycle.  Denies any pelvic pain or vaginal bleeding.  Has had 2 episodes of NBNB emesis.  Currently nauseous.  Denies fever, chills, chest pain, shortness of breath, dysuria, constipation, rashes or lesions.  Denies any bleeding or drainage from wounds.  No surrounding erythema, warmth, fluctuance or induration to wounds.  Denies additional aggravating relieving factors.  History obtained from patient and past medical records.  No interpreter is used.  No current Obgyn  Has not taken her home ABX today  HPI    History reviewed. No pertinent past medical history.  Patient Active Problem List   Diagnosis Date Noted  . TOA (tubo-ovarian abscess) 05/03/2020  . Peritonitis due to abscess (HCC) 04/28/2020    Past Surgical History:  Procedure Laterality Date  . LAPAROSCOPIC APPENDECTOMY N/A 04/28/2020   Procedure: APPENDECTOMY LAPAROSCOPIC;  Surgeon: Manus Rudd, MD;  Location: [facility] OR;  Service: General;  Laterality: N/A;     OB History   No obstetric history on file.     No family history on file.  Social History   Tobacco Use  . Smoking status: Current Every Day Smoker    Packs/day: 0.50    Types: Cigarettes  . Smokeless tobacco: Never Used  Substance Use Topics  . Alcohol use: Yes  . Drug use: Never    Home Medications Prior to Admission medications   Medication Sig Start Date End Date Taking? Authorizing Provider  acetaminophen (TYLENOL) 325 MG tablet Take 2 tablets (650 mg total) by mouth every 6 (six) hours  as needed. Patient taking differently: Take 650 mg by mouth every 6 (six) hours as needed for mild pain.  04/30/20  Yes Maczis, Elmer Sow, PA-C  doxycycline (VIBRA-TABS) 100 MG tablet Take 1 tablet (100 mg total) by mouth 2 (two) times daily. 04/30/20  Yes Maczis, Elmer Sow, PA-C  metroNIDAZOLE (FLAGYL) 500 MG tablet Take 1 tablet (500 mg total) by mouth 2 (two) times daily with a meal. DO NOT CONSUME ALCOHOL WHILE TAKING THIS MEDICATION. 04/30/20  Yes Maczis, Elmer Sow, PA-C  ondansetron (ZOFRAN-ODT) 4 MG disintegrating tablet Take 1 tablet (4 mg total) by mouth every 6 (six) hours as needed for nausea. 04/30/20  Yes Maczis, Elmer Sow, PA-C  oxyCODONE (OXY IR/ROXICODONE) 5 MG immediate release tablet Take 1 tablet (5 mg total) by mouth every 6 (six) hours as needed for breakthrough pain. 04/30/20  Yes Maczis, Elmer Sow, PA-C    Allergies    Patient has no known allergies.  Review of Systems   Review of Systems  Constitutional: Negative.   HENT: Negative.   Respiratory: Negative.   Cardiovascular: Negative.   Gastrointestinal: Positive for abdominal pain, nausea and vomiting. Negative for abdominal distention, anal bleeding, blood in stool, constipation, diarrhea and rectal pain.  Genitourinary: Negative.   Musculoskeletal: Negative.   Skin: Negative.   Neurological: Negative.   All other systems reviewed and are negative.   Physical Exam Updated Vital Signs BP 124/76 (BP Location: Left Arm)   Pulse 74  Temp 98.6 F (37 C) (Oral)   Resp 16   SpO2 100%   Physical Exam Vitals and nursing note reviewed.  Constitutional:      General: She is not in acute distress.    Appearance: She is well-developed. She is not ill-appearing, toxic-appearing or diaphoretic.  HENT:     Head: Normocephalic and atraumatic.     Mouth/Throat:     Mouth: Mucous membranes are moist.  Eyes:     Pupils: Pupils are equal, round, and reactive to light.  Cardiovascular:     Rate and Rhythm: Normal rate.      Pulses: Normal pulses.     Heart sounds: Normal heart sounds.  Pulmonary:     Effort: Pulmonary effort is normal. No respiratory distress.     Breath sounds: Normal breath sounds.  Abdominal:     General: There is no distension.     Tenderness: There is abdominal tenderness in the right lower quadrant, suprapubic area and left lower quadrant. There is guarding. There is no right CVA tenderness, left CVA tenderness or rebound. Negative signs include Murphy's sign and McBurney's sign.     Hernia: No hernia is present.     Comments: Laparoscopic incision sites without bleeding or drainage.  Appropriate wound healing.    Genitourinary:    Comments: Normal appearing external female genitalia without rashes or lesions, normal vaginal epithelium. Cervix with yellow discharge. No cervical petechiae. Cervical os is closed. There is mild bleeding noted at the os. No Odor. Bimanual: Mild CMT, nontender.  No palpable adnexal masses or tenderness. Uterus midline and not fixed. Rectovaginal exam was deferred.  No cystocele or rectocele noted. No pelvic lymphadenopathy noted. Wet prep was obtained.  Cultures for gonorrhea and chlamydia collected. Exam performed with chaperone in room. Musculoskeletal:        General: Normal range of motion.     Cervical back: Normal range of motion.  Skin:    General: Skin is warm and dry.     Capillary Refill: Capillary refill takes less than 2 seconds.     Comments: No edema, erythema or warmth.  No fluctuance or induration.  No bleeding or drainage from postop sites  Neurological:     Mental Status: She is alert.    ED Results / Procedures / Treatments   Labs (all labs ordered are listed, but only abnormal results are displayed) Labs Reviewed  WET PREP, GENITAL - Abnormal; Notable for the following components:      Result Value   WBC, Wet Prep HPF POC MODERATE (*)    All other components within normal limits  COMPREHENSIVE METABOLIC PANEL - Abnormal; Notable for  the following components:   Potassium 2.8 (*)    Glucose, Bld 102 (*)    Creatinine, Ser 1.03 (*)    Calcium 8.5 (*)    Total Protein 5.4 (*)    Albumin 2.6 (*)    AST 11 (*)    Total Bilirubin <0.1 (*)    All other components within normal limits  CBC - Abnormal; Notable for the following components:   WBC 10.9 (*)    RBC 3.57 (*)    Hemoglobin 10.9 (*)    HCT 31.4 (*)    Platelets 472 (*)    All other components within normal limits  URINALYSIS, ROUTINE W REFLEX MICROSCOPIC - Abnormal; Notable for the following components:   Leukocytes,Ua TRACE (*)    All other components within normal limits  SARS CORONAVIRUS 2 BY  RT PCR (HOSPITAL ORDER, PERFORMED IN [place] [facility])  LIPASE, BLOOD  I-STAT BETA HCG BLOOD, ED ([facility], [facility], [facility] ONLY)  GC/CHLAMYDIA PROBE AMP ([place]) NOT AT [facility]    EKG None  Radiology CT Abdomen Pelvis W Contrast  Result Date: 05/03/2020 CLINICAL DATA:  Right lower quadrant pain.  Recent appendectomy. EXAM: CT ABDOMEN AND PELVIS WITH CONTRAST TECHNIQUE: Multidetector CT imaging of the abdomen and pelvis was performed using the standard protocol following bolus administration of intravenous contrast. CONTRAST:  OMNIPAQUE IOHEXOL 300 MG/ML  SOLN COMPARISON:  04/28/2020 FINDINGS: Lower chest: New small bilateral pleural effusions. Minimal dependent atelectasis in the visualized lung bases. Hepatobiliary: No focal liver abnormality is seen. No gallstones, gallbladder wall thickening, or biliary dilatation. Pancreas: Unremarkable. No pancreatic ductal dilatation or surrounding inflammatory changes. Spleen: Normal in size without focal abnormality. Adrenals/Urinary Tract: Adrenal glands are unremarkable. Kidneys are normal, without renal calculi, focal lesion, or hydronephrosis. Bladder is unremarkable. Stomach/Bowel: Stomach is nondistended. Small bowel decompressed. Staple line at the base of the cecum. The colon is nondilated, unremarkable.  Vascular/Lymphatic: No significant vascular findings are present. No enlarged abdominal or pelvic lymph nodes. Reproductive: Uterus unremarkable. 1.6 cm right adnexal cyst possibly physiologic. Other: No free air. There is a small amount of pelvic ascites. There are diffuse inflammatory/edematous changes in the pelvis, new since previous. There is a transverse elongated small fluid collection interposed between the uterine fundus and urinary bladder with peripheral enhancement, largest component to the left of midline measuring up to 1.7 cm. Musculoskeletal: No free air. IMPRESSION: 1. Diffuse pelvic inflammatory changes with small transverse loculated fluid collection between the uterine fundus and urinary bladder, may represent small postop abscess versus PID. Electronically Signed   By: Corlis Leak M.D.   On: 05/03/2020 13:10    Procedures Procedures (including critical care time)  Medications Ordered in ED Medications  cefTRIAXone (ROCEPHIN) injection 500 mg (has no administration in time range)  doxycycline (VIBRAMYCIN) 100 mg in sodium chloride 0.9 % 250 mL IVPB (has no administration in time range)  metroNIDAZOLE (FLAGYL) IVPB 500 mg (has no administration in time range)  sodium chloride flush (NS) 0.9 % injection 3 mL (3 mLs Intravenous Given 05/03/20 1102)  ondansetron (ZOFRAN) injection 4 mg (4 mg Intravenous Given 05/03/20 1102)  sodium chloride 0.9 % bolus 1,000 mL (1,000 mLs Intravenous New Bag/Given 05/03/20 1101)  morphine 4 MG/ML injection 4 mg (4 mg Intravenous Given 05/03/20 1103)  potassium chloride 10 mEq in 100 mL IVPB (10 mEq Intravenous New Bag/Given 05/03/20 1352)  iohexol (OMNIPAQUE) 300 MG/ML solution 100 mL (100 mLs Intravenous Contrast Given 05/03/20 1201)  HYDROmorphone (DILAUDID) injection 1 mg (1 mg Intravenous Given 05/03/20 1349)    ED Course  I have reviewed the triage vital signs and the nursing notes.  Pertinent labs & imaging results that were available during my care of  the patient were reviewed by me and considered in my medical decision making (see chart for details).  29 year old female appears otherwise well presents for evaluation right lower quadrant pain.  She is 6 days post lap appendectomy, possible ruptured right TOA versus PID.  Afebrile, nonseptic, non-ill-appearing.  Surgical sites appear well-healing without evidence of infectious process.  She has moderate tenderness to her suprapubic and right lower quadrant.  There is guarding without rebound.  She is currently on her menstrual cycle however no vaginal discharge, pelvic pain, concerns for STDs.   Labs and imaging obtained from triage which I personally  reviewed and interpreted: CBC with leukocytosis at 10.9, downtrending from prior Metabolic panel with creatinine 1.03, significant improvement from prior, hypokalemia at 2.8 will replace Pregnancy negative Lipase 38  Patient reassessed. Pending CT AP.   Patient reassessed.  Requesting additional pain medicine.  Discussed CT findings of possible abscess vs PID. States compliant with ABX previous proscribed. Reviewed prior GC/ Chlamydia with positive Gonorrhea on previous admission. Plan on repeat pelvic and swab today.  Patient with mild CMT and right adnexal tenderness.  Mild yellow discharge at vault however also bleeding due to current menstrual cycle.  Patient has required Dilaudid and morphine for pain control.  Still rates pain a 6/10 however does not want additional medicine as she states her medicine "made me feel weird." Will need admission for pain control given she has been taking Norco at home without relief.  Given patient was previously admitted to general surgery service will consult them prior to OB/GYN.  Consult with Dr. Barry Dienes with general surgery service.  States his appendix was normal on prior CT scan this is likely an OB/GYN issue and she recommends OB/GYN admission.  Consult with Dr. Kennon Rounds with OB/GYN.  She will place  admission orders and agree to accept patient to GYN service.  We will start IV medications for PID, TOA on imaging given has not taken home medications. Does not appear septic at this time.  The patient appears reasonably stabilized for admission considering the current resources, flow, and capabilities available in the ED at this time, and I doubt any other [facility] requiring further screening and/or treatment in the ED prior to admission.    MDM Rules/Calculators/A&P                       Final Clinical Impression(s) / ED Diagnoses Final diagnoses:  TOA (tubo-ovarian abscess)  PID (acute pelvic inflammatory disease)  Hypokalemia  Leukocytosis, unspecified type    Rx / DC Orders ED Discharge Orders    None       Jaira Canady A, PA-C 05/03/20 1544    Reynolds, [place], DO 05/03/20 1629

## 2020-05-03 NOTE — ED Notes (Signed)
Attempted report x1. 

## 2020-05-03 NOTE — H&P (Signed)
Barbara Barnett is an 29 y.o. No obstetric history on file. female.   Chief Complaint: Pelvic pain HPI: Patient admitted on 04/28/2020 for presumed appendicitis.  Taken to the OR on the same day, noted to have a normal appendix and what appeared to be a ruptured right TOA.  The patient's antibiotics were changed to allow treatment for PID and she was sent home on postoperative day #2.  She was sent home on doxycycline and Flagyl.  She reports being able to take those but has thrown up more recently and her pain is poorly controlled on oral medications.  The patient was noted to be positive for gonorrhea on 04/29/2020.  History reviewed. No pertinent past medical history.  Past Surgical History:  Procedure Laterality Date  . LAPAROSCOPIC APPENDECTOMY N/A 04/28/2020   Procedure: APPENDECTOMY LAPAROSCOPIC;  Surgeon: Manus Rudd, MD;  Location: [facility] OR;  Service: General;  Laterality: N/A;    No family history on file. Social History:  reports that she has been smoking cigarettes. She has been smoking about 0.50 packs per day. She has never used smokeless tobacco. She reports current alcohol use. She reports that she does not use drugs.  Allergies: No Known Allergies  (Not in a hospital admission)   A comprehensive review of systems was negative.  Blood pressure 124/76, pulse 74, temperature 98.6 F (37 C), temperature source Oral, resp. rate 16, SpO2 100 %. General appearance: alert, cooperative and appears stated age Head: Normocephalic, without obvious abnormality, atraumatic Neck: supple, symmetrical, trachea midline Lungs: normal effort Heart: regular rate and rhythm Abdomen: Soft, diffusely tender, positive guarding, negative rebound Extremities: Homans sign is negative, no sign of DVT Skin: Skin color, texture, turgor normal. No rashes or lesions Neurologic: Grossly normal   Lab Results  Component Value Date   WBC 10.9 (H) 05/03/2020   HGB 10.9 (L) 05/03/2020   HCT 31.4 (L)  05/03/2020   MCV 88.0 05/03/2020   PLT 472 (H) 05/03/2020   Lab Results  Component Value Date   HCG <5.0 05/03/2020    CT ABDOMEN PELVIS WO CONTRAST  Result Date: 04/28/2020 CLINICAL DATA:  Lower abdominal pain. EXAM: CT ABDOMEN AND PELVIS WITHOUT CONTRAST TECHNIQUE: Multidetector CT imaging of the abdomen and pelvis was performed following the standard protocol without IV contrast. COMPARISON:  None. FINDINGS: Lower chest: No significant pulmonary nodules or acute consolidative airspace disease. Hepatobiliary: Normal liver size. No liver mass. Layering mildly dense material in the gallbladder. No gallbladder wall thickening or pericholecystic fluid. No biliary ductal dilatation. Pancreas: Normal, with no mass or duct dilation. Spleen: Normal size. No mass. Adrenals/Urinary Tract: Normal adrenals. No renal stones. No hydronephrosis. No contour deforming renal masses. Normal bladder. Stomach/Bowel: Normal non-distended stomach. Normal caliber small bowel with no small bowel wall thickening. Appendix is mildly dilated (7 mm diameter) and poorly evaluated on this scan performed without IV or oral contrast. Normal large bowel with no diverticulosis, large bowel wall thickening or pericolonic fat stranding. Vascular/Lymphatic: Normal caliber abdominal aorta. No pathologically enlarged lymph nodes in the abdomen or pelvis. Reproductive: Grossly normal uterus.  No adnexal mass. Other: No pneumoperitoneum, ascites or focal fluid collection. Musculoskeletal: No aggressive appearing focal osseous lesions. IMPRESSION: 1. Mildly dilated appendix, poorly evaluated on this scan performed without IV or oral contrast. If there is clinical concern for acute appendicitis, recommend short-term follow-up CT abdomen/pelvis performed with oral and IV contrast. 2. Layering mildly dense material in the gallbladder, which could represent sludge or small gallstones. No noncontrast CT  evidence of acute cholecystitis. No biliary  ductal dilatation. Electronically Signed   By: Ilona Sorrel M.D.   On: 04/28/2020 05:11   CT Abdomen Pelvis W Contrast  Result Date: 05/03/2020 CLINICAL DATA:  Right lower quadrant pain.  Recent appendectomy. EXAM: CT ABDOMEN AND PELVIS WITH CONTRAST TECHNIQUE: Multidetector CT imaging of the abdomen and pelvis was performed using the standard protocol following bolus administration of intravenous contrast. CONTRAST:  139mL OMNIPAQUE IOHEXOL 300 MG/ML  SOLN COMPARISON:  04/28/2020 FINDINGS: Lower chest: New small bilateral pleural effusions. Minimal dependent atelectasis in the visualized lung bases. Hepatobiliary: No focal liver abnormality is seen. No gallstones, gallbladder wall thickening, or biliary dilatation. Pancreas: Unremarkable. No pancreatic ductal dilatation or surrounding inflammatory changes. Spleen: Normal in size without focal abnormality. Adrenals/Urinary Tract: Adrenal glands are unremarkable. Kidneys are normal, without renal calculi, focal lesion, or hydronephrosis. Bladder is unremarkable. Stomach/Bowel: Stomach is nondistended. Small bowel decompressed. Staple line at the base of the cecum. The colon is nondilated, unremarkable. Vascular/Lymphatic: No significant vascular findings are present. No enlarged abdominal or pelvic lymph nodes. Reproductive: Uterus unremarkable. 1.6 cm right adnexal cyst possibly physiologic. Other: No free air. There is a small amount of pelvic ascites. There are diffuse inflammatory/edematous changes in the pelvis, new since previous. There is a transverse elongated small fluid collection interposed between the uterine fundus and urinary bladder with peripheral enhancement, largest component to the left of midline measuring up to 1.7 cm. Musculoskeletal: No free air. IMPRESSION: 1. Diffuse pelvic inflammatory changes with small transverse loculated fluid collection between the uterine fundus and urinary bladder, may represent small postop abscess versus PID.  Electronically Signed   By: Lucrezia Europe M.D.   On: 05/03/2020 13:10    Assessment/Plan Principal Problem:   TOA (tubo-ovarian abscess)  For admission with IV Abx and IV pain meds, anti-nausea meds.      Barbara Barnett 05/03/2020, 3:35 PM

## 2020-05-03 NOTE — ED Triage Notes (Signed)
Patient had appendectomy on 5/31 and complains of ongoing pain. Has not called surgeon. Alert and oriented, NAD

## 2020-05-04 ENCOUNTER — Inpatient Hospital Stay (HOSPITAL_COMMUNITY): Payer: No Typology Code available for payment source

## 2020-05-04 DIAGNOSIS — E876 Hypokalemia: Secondary | ICD-10-CM | POA: Diagnosis present

## 2020-05-04 DIAGNOSIS — N730 Acute parametritis and pelvic cellulitis: Secondary | ICD-10-CM | POA: Diagnosis present

## 2020-05-04 DIAGNOSIS — Z20822 Contact with and (suspected) exposure to covid-19: Secondary | ICD-10-CM | POA: Diagnosis present

## 2020-05-04 DIAGNOSIS — F17210 Nicotine dependence, cigarettes, uncomplicated: Secondary | ICD-10-CM | POA: Diagnosis present

## 2020-05-04 DIAGNOSIS — N7093 Salpingitis and oophoritis, unspecified: Secondary | ICD-10-CM | POA: Diagnosis present

## 2020-05-04 DIAGNOSIS — R1031 Right lower quadrant pain: Secondary | ICD-10-CM | POA: Diagnosis present

## 2020-05-04 MED ORDER — POTASSIUM CHLORIDE CRYS ER 20 MEQ PO TBCR
20.0000 meq | EXTENDED_RELEASE_TABLET | ORAL | Status: AC
  Administered 2020-05-04 (×4): 20 meq via ORAL
  Filled 2020-05-04 (×4): qty 1

## 2020-05-04 MED ORDER — LABETALOL HCL 100 MG PO TABS
200.0000 mg | ORAL_TABLET | Freq: Two times a day (BID) | ORAL | Status: DC
  Administered 2020-05-04 – 2020-05-06 (×4): 200 mg via ORAL
  Filled 2020-05-04 (×4): qty 2

## 2020-05-04 NOTE — Progress Notes (Signed)
Faculty Note   Called by RN to see patient as she is not feeling well.  In to see patient, she states she is feeling very groggy and just not like herself. States she has been sleeping a lot. States she feels like she is having chest tightness and it is more difficult to breathe than normal. No pain with inspiration.   BP (!) 171/94 (BP Location: Left Arm)   Pulse 62   Temp 99.2 F (37.3 C) (Oral)   Resp 17   Ht 5' (1.524 m)   Wt 64 kg   SpO2 100%   BMI 27.56 kg/m  Resp: no difficulty with breathing noted, normal respiratory effort Abd: soft, moderately tender throughout Ext: no LE edema, no calf pain   A/P: 29 yo admitted for IV antibiotics for ruptured TOA. Given new onset chest tightness, EKG and CXR orderd, also incentive spirometer. Sat 100 % on RA, VSS. She is on lovenox and suspicion for PE low. Reviewed course and need to stay on IV antibiotics until clinically improved.  CXR EKG Incentive spirometer Cont antibiotics Pain meds prn   Baldemar Lenis, M.D. Attending [facility] for [employer] [facility])

## 2020-05-04 NOTE — Progress Notes (Signed)
Patient ID: Barbara Barnett, female   DOB: 10/21/1991, 29 y.o.   MRN: [id number] Barbara Barnett is an 29 y.o. No obstetric history on file. female.   Patient reports some improvement in her abdominal pain since admission. She denies nausea or emesis. She has not eaten breakfast as she states she doesn't usually eat breakfast. She plans on eating later  History reviewed. No pertinent past medical history.  Past Surgical History:  Procedure Laterality Date  . LAPAROSCOPIC APPENDECTOMY N/A 04/28/2020   Procedure: APPENDECTOMY LAPAROSCOPIC;  Surgeon: Donnie Mesa, MD;  Location: [facility];  Service: General;  Laterality: N/A;    No family history on file. Social History:  reports that she has been smoking cigarettes. She has been smoking about 0.50 packs per day. She has never used smokeless tobacco. She reports current alcohol use. She reports that she does not use drugs.  Allergies: No Known Allergies  Medications Prior to Admission  Medication Sig Dispense Refill  . acetaminophen (TYLENOL) 325 MG tablet Take 2 tablets (650 mg total) by mouth every 6 (six) hours as needed. (Patient taking differently: Take 650 mg by mouth every 6 (six) hours as needed for mild pain. )    . doxycycline (VIBRA-TABS) 100 MG tablet Take 1 tablet (100 mg total) by mouth 2 (two) times daily. 26 tablet 0  . metroNIDAZOLE (FLAGYL) 500 MG tablet Take 1 tablet (500 mg total) by mouth 2 (two) times daily with a meal. DO NOT CONSUME ALCOHOL WHILE TAKING THIS MEDICATION. 26 tablet 0  . ondansetron (ZOFRAN-ODT) 4 MG disintegrating tablet Take 1 tablet (4 mg total) by mouth every 6 (six) hours as needed for nausea. 20 tablet 0  . oxyCODONE (OXY IR/ROXICODONE) 5 MG immediate release tablet Take 1 tablet (5 mg total) by mouth every 6 (six) hours as needed for breakthrough pain. 20 tablet 0    A comprehensive review of systems was negative.  Blood pressure (!) 158/93, pulse 62, temperature 98.8 F (37.1 C), temperature source  Oral, resp. rate 18, height 5' (1.524 m), weight 64 kg, SpO2 98 %. General appearance: alert, cooperative and appears stated age Head: Normocephalic, without obvious abnormality, atraumatic Neck: supple, symmetrical, trachea midline Lungs: normal effort Heart: regular rate and rhythm Abdomen: Soft, diffusely tender, positive guarding, negative rebound. Most of her pain is on the right side Extremities: Homans sign is negative, no sign of DVT Skin: Skin color, texture, turgor normal. No rashes or lesions Neurologic: Grossly normal   Lab Results  Component Value Date   WBC 9.0 05/03/2020   HGB 10.4 (L) 05/03/2020   HCT 29.7 (L) 05/03/2020   MCV 86.3 05/03/2020   PLT 456 (H) 05/03/2020   Lab Results  Component Value Date   HCG <5.0 05/03/2020    CT ABDOMEN PELVIS WO CONTRAST  Result Date: 04/28/2020 CLINICAL DATA:  Lower abdominal pain. EXAM: CT ABDOMEN AND PELVIS WITHOUT CONTRAST TECHNIQUE: Multidetector CT imaging of the abdomen and pelvis was performed following the standard protocol without IV contrast. COMPARISON:  None. FINDINGS: Lower chest: No significant pulmonary nodules or acute consolidative airspace disease. Hepatobiliary: Normal liver size. No liver mass. Layering mildly dense material in the gallbladder. No gallbladder wall thickening or pericholecystic fluid. No biliary ductal dilatation. Pancreas: Normal, with no mass or duct dilation. Spleen: Normal size. No mass. Adrenals/Urinary Tract: Normal adrenals. No renal stones. No hydronephrosis. No contour deforming renal masses. Normal bladder. Stomach/Bowel: Normal non-distended stomach. Normal caliber small bowel with no small bowel wall thickening. Appendix is  mildly dilated (7 mm diameter) and poorly evaluated on this scan performed without IV or oral contrast. Normal large bowel with no diverticulosis, large bowel wall thickening or pericolonic fat stranding. Vascular/Lymphatic: Normal caliber abdominal aorta. No  pathologically enlarged lymph nodes in the abdomen or pelvis. Reproductive: Grossly normal uterus.  No adnexal mass. Other: No pneumoperitoneum, ascites or focal fluid collection. Musculoskeletal: No aggressive appearing focal osseous lesions. IMPRESSION: 1. Mildly dilated appendix, poorly evaluated on this scan performed without IV or oral contrast. If there is clinical concern for acute appendicitis, recommend short-term follow-up CT abdomen/pelvis performed with oral and IV contrast. 2. Layering mildly dense material in the gallbladder, which could represent sludge or small gallstones. No noncontrast CT evidence of acute cholecystitis. No biliary ductal dilatation. Electronically Signed   By: Delbert Phenix M.D.   On: 04/28/2020 05:11   CT Abdomen Pelvis W Contrast  Result Date: 05/03/2020 CLINICAL DATA:  Right lower quadrant pain.  Recent appendectomy. EXAM: CT ABDOMEN AND PELVIS WITH CONTRAST TECHNIQUE: Multidetector CT imaging of the abdomen and pelvis was performed using the standard protocol following bolus administration of intravenous contrast. CONTRAST:  OMNIPAQUE IOHEXOL 300 MG/ML  SOLN COMPARISON:  04/28/2020 FINDINGS: Lower chest: New small bilateral pleural effusions. Minimal dependent atelectasis in the visualized lung bases. Hepatobiliary: No focal liver abnormality is seen. No gallstones, gallbladder wall thickening, or biliary dilatation. Pancreas: Unremarkable. No pancreatic ductal dilatation or surrounding inflammatory changes. Spleen: Normal in size without focal abnormality. Adrenals/Urinary Tract: Adrenal glands are unremarkable. Kidneys are normal, without renal calculi, focal lesion, or hydronephrosis. Bladder is unremarkable. Stomach/Bowel: Stomach is nondistended. Small bowel decompressed. Staple line at the base of the cecum. The colon is nondilated, unremarkable. Vascular/Lymphatic: No significant vascular findings are present. No enlarged abdominal or pelvic lymph nodes.  Reproductive: Uterus unremarkable. 1.6 cm right adnexal cyst possibly physiologic. Other: No free air. There is a small amount of pelvic ascites. There are diffuse inflammatory/edematous changes in the pelvis, new since previous. There is a transverse elongated small fluid collection interposed between the uterine fundus and urinary bladder with peripheral enhancement, largest component to the left of midline measuring up to 1.7 cm. Musculoskeletal: No free air. IMPRESSION: 1. Diffuse pelvic inflammatory changes with small transverse loculated fluid collection between the uterine fundus and urinary bladder, may represent small postop abscess versus PID. Electronically Signed   By: Corlis Leak M.D.   On: 05/03/2020 13:10    Assessment/Plan Principal Problem:   TOA (tubo-ovarian abscess)  Continue antibiotics Pain management prn Anti-emetic as needed Advance diet as tolerated   Barbara Barnett 05/04/2020, 11:23 AM

## 2020-05-05 LAB — GC/CHLAMYDIA PROBE AMP (~~LOC~~) NOT AT ARMC
Chlamydia: NEGATIVE
Comment: NEGATIVE
Comment: NORMAL
Neisseria Gonorrhea: NEGATIVE

## 2020-05-05 NOTE — Plan of Care (Signed)
  Problem: Education: Goal: Knowledge of General Education information will improve Description Including pain rating scale, medication(s)/side effects and non-pharmacologic comfort measures Outcome: Progressing   

## 2020-05-05 NOTE — Discharge Summary (Signed)
Physician Discharge Summary  Patient ID: Barbara Barnett MRN: [id number] DOB/AGE: [birth year] 30 y.o.  Admit date: 05/03/2020 Discharge date: 05/06/2020  Admission Diagnoses: TOA  Discharge Diagnoses:  Principal Problem:   TOA (tubo-ovarian abscess)   Discharged Condition: good  Hospital Course: Patient admitted following failed outpatient management of TOA. Patient diagnosed with TOA at the time of her [facility] appendectomy 8 days ago. She presented to the hospital with nausea/emesis and abdominal pain. Patient received IV antibiotics with improvement of her pain. She remained afebrile for over 48 hours. She tolerated a regular diet and was found stable for discharge. Discharge instructions were reviewed  Consults: None   Treatments: antibiotics: cefoxitin/doxy  Discharge Exam: Blood pressure (!) 164/91, pulse 71, temperature 98.8 F (37.1 C), temperature source Oral, resp. rate 18, height 5' (1.524 m), weight 64 kg, SpO2 100 %. GENERAL: Well-developed, well-nourished female in no acute distress.  LUNGS: Clear to auscultation bilaterally.  HEART: Regular rate and rhythm. ABDOMEN: Soft, nontender, nondistended. No organomegaly. PELVIC: Not performed EXTREMITIES: No cyanosis, clubbing, or edema, 2+ distal pulses.    Disposition:  There are no questions and answers to display.         Allergies as of 05/06/2020   No Known Allergies     Medication List    TAKE these medications   acetaminophen 325 MG tablet Commonly known as: TYLENOL Take 2 tablets (650 mg total) by mouth every 6 (six) hours as needed. What changed: reasons to take this   amLODIPine-Valsartan-HCTZ 5-160-25 MG Tabs Take 1 tablet by mouth daily.   doxycycline 100 MG tablet Commonly known as: VIBRA-TABS Take 1 tablet (100 mg total) by mouth 2 (two) times daily.   ibuprofen 600 MG tablet Commonly known as: ADVIL Take 1 tablet (600 mg total) by mouth every 6 (six) hours as needed (mild pain).    metroNIDAZOLE 500 MG tablet Commonly known as: Flagyl Take 1 tablet (500 mg total) by mouth 2 (two) times daily with a meal. DO NOT CONSUME ALCOHOL WHILE TAKING THIS MEDICATION.   ondansetron 4 MG disintegrating tablet Commonly known as: ZOFRAN-ODT Take 1 tablet (4 mg total) by mouth every 6 (six) hours as needed for nausea.   oxyCODONE 5 MG immediate release tablet Commonly known as: Oxy IR/ROXICODONE Take 1 tablet (5 mg total) by mouth every 6 (six) hours as needed for breakthrough pain.      Follow-up Information    [facility] Follow up.   Specialty: Obstetrics and Gynecology Why: An appointment will be made for you to follow up in 2 weeks Contact information: [address] [place] [address] [phone]          Signed: Catalina Antigua 05/06/2020, 10:19 AM

## 2020-05-05 NOTE — Discharge Instructions (Signed)
Pelvic Inflammatory Disease  Pelvic inflammatory disease (PID) is caused by an infection in some or all of the female reproductive organs. The infection can be in the uterus, ovaries, fallopian tubes, or the surrounding tissues in the pelvis. PID can cause abdominal or pelvic pain that comes on suddenly (acute pelvic pain). PID is a serious infection because it can lead to lasting (chronic) pelvic pain or the inability to have children (infertility). What are the causes? This condition is most often caused by bacteria that is spread during sexual contact. It can also be caused by a bacterial infection of the vagina (bacterial vaginosis) that is not spread by sexual contact. This condition occurs when the infection is not treated and the bacteria travel upward from the vagina or cervix into the reproductive organs. Bacteria may also be introduced into the reproductive organs following:  The birth of a baby.  A miscarriage.  An abortion.  Major pelvic surgery.  The insertion of an intrauterine device (IUD).  A sexual assault. What increases the risk? You are more likely to develop this condition if you:  Are younger than 30 years of age.  Are sexually active at a young age.  Have a history of STI (sexually transmitted infection) or PID.  Do not regularly use barrier contraception methods, such as condoms.  Have multiple sexual partners.  Have sex with someone who has symptoms of an STI.  Use a vaginal douche.  Have recently had an IUD inserted. What are the signs or symptoms? Symptoms of this condition include:  Abdominal or pelvic pain.  Fever.  Chills.  Abnormal vaginal discharge.  Abnormal uterine bleeding.  Unusual pain shortly after the end of a menstrual period.  Painful urination.  Pain with sex.  Nausea and vomiting. How is this diagnosed? This condition is diagnosed based on a pelvic exam and medical history. A pelvic exam can reveal signs of  infection, inflammation, and discharge in the vagina and the surrounding tissues. It can also help to identify painful areas. You may also have tests, such as:  Lab tests, including a pregnancy test, blood tests, and a urine test.  Culture tests of the vagina and cervix to check for an STI.  Ultrasound.  A laparoscopic procedure to look inside the pelvis.  Examination of vaginal discharge under a microscope. How is this treated? This condition may be treated with:  Antibiotic medicines taken by mouth (orally). For more severe cases, antibiotics may be given through an IV at the hospital.  Surgery. This is rare. Surgery may be needed if other treatments do not help.  Efforts to stop the spread of the infection. Sexual partners may need to be treated if the infection is caused by an STI. It may take weeks until you are completely well. If you are diagnosed with PID, you should also be checked for HIV (human immunodeficiency virus). Your health care provider may test you for infection again 3 months after treatment. You should not have unprotected sex. Follow these instructions at home:  Take over-the-counter and prescription medicines only as told by your health care provider.  If you were prescribed an antibiotic medicine, take it as told by your health care provider. Do not stop using the antibiotic even if you start to feel better.  Do not have sex until treatment is completed or as told by your health care provider. If PID is confirmed, your recent sexual partners will need treatment, especially if you had unprotected sex.  Keep all   follow-up visits as told by your health care provider. This is important. Contact a health care provider if:  You have increased or abnormal vaginal discharge.  Your pain does not improve.  You vomit.  You have a fever.  You cannot tolerate your medicines.  Your partner has an STI.  You have pain when you urinate. Get help right away if:   You have increased abdominal or pelvic pain.  You have chills.  Your symptoms are not better in 72 hours with treatment. Summary  Pelvic inflammatory disease (PID) is caused by an infection in some or all of the female reproductive organs.  PID is a serious infection because it can lead to lasting (chronic) pelvic pain or the inability to have children (infertility).  This infection is usually treated with antibiotic medicines.  Do not have sex until treatment is completed or as told by your health care provider. This information is not intended to replace advice given to you by your health care provider. Make sure you discuss any questions you have with your health care provider. Document Revised: 08/03/2018 Document Reviewed: 08/08/2018 Elsevier Patient Education  2020 [employer]  

## 2020-05-05 NOTE — Progress Notes (Signed)
° ° °  Gynecology Progress Note  Admission Date: 05/03/2020 Current Date: 05/05/2020 8:30 AM  Barbara Barnett is a 29 y.o. HD#3 admitted for IV antibiotics management for ruptured TOA   History complicated by: Patient Active Problem List   Diagnosis Date Noted   TOA (tubo-ovarian abscess) 05/03/2020   Peritonitis due to abscess (HCC) 04/28/2020    Subjective:  Pt feeling somewhat better this am. Still with some difficulty breathing but has been doing incentive spirometer, feels like she cannot do it properly. Eating, drinking without issue. Ambulating. Still having abdominal pain.  Objective:   Vitals:   05/04/20 2023 05/04/20 2353 05/05/20 0146 05/05/20 0535  BP: (!) 171/94 (!) 160/91 131/80 (!) 139/96  Pulse: 62 61 64 78  Resp: 17 18 17 17   Temp: 99.2 F (37.3 C) 98.9 F (37.2 C) 98.3 F (36.8 C) 98.8 F (37.1 C)  TempSrc: Oral Oral Oral Oral  SpO2: 100% 100% 100% 100%  Weight:      Height:        No intake/output data recorded.  Intake/Output Summary (Last 24 hours) at 05/05/2020 0830 Last data filed at 05/05/2020 0536 Gross per 24 hour  Intake 1898 ml  Output --  Net 1898 ml     Physical exam: BP (!) 139/96    Pulse 78    Temp 98.8 F (37.1 C) (Oral)    Resp 17    Ht 5' (1.524 m)    Wt 64 kg    SpO2 100%    BMI 27.56 kg/m  CONSTITUTIONAL: Well-developed, well-nourished female in no acute distress.  HENT:  Normocephalic, atraumatic, External right and left ear normal. Oropharynx is clear and moist EYES: Conjunctivae and EOM are normal. Pupils are equal, round, and reactive to light. No scleral icterus.  NECK: Normal range of motion, supple, no masses.  Normal thyroid.  SKIN: Skin is warm and dry. No rash noted. Not diaphoretic. No erythema. No pallor. NEUROLOGIC: Alert and oriented to person, place, and time. Normal reflexes, muscle tone coordination. No cranial nerve deficit noted. PSYCHIATRIC: Normal mood and affect. Normal behavior. Normal judgment and thought  content. CARDIOVASCULAR: Normal heart rate noted RESPIRATORY: Effort normal, no problems with respiration noted. ABDOMEN: Soft, moderately tender throughout, no distention noted. No rebound or guarding.  PELVIC: deferred MUSCULOSKELETAL: Normal range of motion. No tenderness.  No cyanosis, clubbing, or edema.    UOP: voiding spontaneously  Labs  Recent Labs  Lab 04/30/20 0439 05/03/20 0813 05/03/20 1753  WBC 9.1 10.9* 9.0  HGB 8.9* 10.9* 10.4*  HCT 26.1* 31.4* 29.7*  PLT 244 472* 456*     Assessment & Plan:   Patient is 29 y.o. HD#3 s/p laparoscopy for suspected appendicitis, found to have ruptured TOA, admitted for IV antibiotics. Doing better. Normal CXR. O2 sat normal. Encouraged incentive spirometer use, likely home tomorrow based on pain and improvement in clinical picture.  Cont labetalol  Cont pain meds prn Incentive spirometer Cont IV antibiotics   K. 26, M.D. [facility]ending [facility] [employer])

## 2020-05-06 MED ORDER — AMLODIPINE-VALSARTAN-HCTZ 5-160-25 MG PO TABS: 1.0000 | ORAL_TABLET | Freq: Every day | ORAL | 3 refills | Status: DC

## 2020-05-06 MED ORDER — IBUPROFEN 600 MG PO TABS: 600.0000 mg | ORAL_TABLET | Freq: Four times a day (QID) | ORAL | 3 refills | Status: DC | PRN

## 2020-05-06 NOTE — Progress Notes (Signed)
Discharged patient to home, AVS given and explained. Denies any pain, concerns addressed and questions answered to patient's satisfaction. Belongings returned accordingly.

## 2020-05-26 ENCOUNTER — Other Ambulatory Visit: Payer: Self-pay

## 2020-05-26 ENCOUNTER — Encounter: Payer: Self-pay | Admitting: Obstetrics and Gynecology

## 2020-05-26 ENCOUNTER — Ambulatory Visit (INDEPENDENT_AMBULATORY_CARE_PROVIDER_SITE_OTHER): Payer: No Typology Code available for payment source | Admitting: Obstetrics and Gynecology

## 2020-05-26 VITALS — BP 133/90 | HR 89 | Ht 62.0 in | Wt 125.0 lb

## 2020-05-26 DIAGNOSIS — N7093 Salpingitis and oophoritis, unspecified: Secondary | ICD-10-CM | POA: Diagnosis not present

## 2020-05-26 NOTE — Progress Notes (Signed)
   Subjective:    Patient ID: Barbara Barnett, female    DOB: 10/05/1991, 29 y.o.   MRN: [id number]  HPI:  Pt was admitted on 04/28/2020 for presumed appendicitis.  The operative note was reviewed from the surgical procedure.  The appendix was removed, but it was revealed the pt actually had a right tuboovarian abscess.    Right tube was swollen and erythematous, but otherwise normal. She was discharged but readmitted on 05/03/2020 because she could not tolerate oral antibiotics. Pt did well in the hospital and was discharged on 6/8/20201.  Pt has done well since then and has no issues today.    Review of Systems  Constitutional: Negative.   HENT: Negative.   Eyes: Negative.   Respiratory: Negative.   Cardiovascular: Negative.   Gastrointestinal: Negative.   Genitourinary: Negative.   Musculoskeletal: Negative.   Skin: Negative.   Neurological: Negative.   Psychiatric/Behavioral: Negative.          Objective:   Physical Exam Constitutional:      Appearance: Normal appearance.  HENT:     Head: Normocephalic.  Abdominal:     General: Abdomen is flat. There is no distension.     Palpations: Abdomen is soft.     Tenderness: There is no abdominal tenderness.  Genitourinary:    Comments: No CMT, no adnexal pain/mass noted Musculoskeletal:        General: Normal range of motion.  Skin:    General: Skin is warm and dry.  Neurological:     Mental Status: She is alert.  Psychiatric:        Mood and Affect: Mood normal.        Thought Content: Thought content normal.        Judgment: Judgment normal.           Assessment & Plan:   1. TOA (tubo-ovarian abscess) Symptoms have resolved, will get TOC in 3 weeks.  Considered pelvic u/s, but pt is doing well.

## 2020-05-26 NOTE — Progress Notes (Signed)
Was unable to get BP medication will go pick up.

## 2020-05-26 NOTE — Patient Instructions (Signed)
Preventing Sexually Transmitted Infections, Adult Sexually transmitted infections (STIs) are diseases that are passed (transmitted) from person to person through bodily fluids exchanged during sex or sexual contact. Bodily fluids include saliva, semen, blood, vaginal mucus, and urine. You may have an increased risk for developing an STI if you have unprotected oral, vaginal, or anal sex. Some common STIs include:  Herpes.  Hepatitis B.  Chlamydia.  Gonorrhea.  Syphilis.  HPV (human papillomavirus).  HIV (human immunodeficiency virus), the virus that can cause AIDS (acquired immunodeficiency syndrome). How can I protect myself from sexually transmitted infections? The only way to completely prevent STIs is not to have sex of any kind (practice abstinence). This includes oral, vaginal, or anal sex. If you are sexually active, take these actions to lower your risk of getting an STI:  Have only one sex partner (be monogamous) or limit the number of sexual partners you have.  Stay up-to-date on immunizations. Certain vaccines can lower your risk of getting certain STIs, such as: ? Hepatitis A and B vaccines. You may have been vaccinated as a young child, but likely need a booster shot as a teen or young adult. ? HPV vaccine.  Use methods that prevent the exchange of body fluids between partners (barrier protection) every time you have sex. Barrier protection can be used during oral, vaginal, or anal sex. Commonly used barrier methods include: ? Female condom. ? Female condom. ? Dental dam.  Get tested regularly for STIs. Have your sexual partner get tested regularly as well.  Avoid mixing alcohol, drugs, and sex. Alcohol and drug use can affect your ability to make good decisions and can lead to risky sexual behaviors.  Ask your health care provider about taking pre-exposure prophylaxis (PrEP) to prevent HIV infection if you: ? Have a HIV-positive sexual partner. ? Have multiple sexual  partners or partners who do not know their HIV status, and do not regularly use a condom during sex. ? Use injection drugs and share needles. Birth control pills, injections, implants, and intrauterine devices (IUDs) do not protect against STIs. To prevent both STIs and pregnancy, always use a condom with another form of birth control. Some STIs, such as herpes, are spread through skin to skin contact. A condom does not protect you from getting such STIs. If you or your partner have herpes and there is an active flare with open sores, avoid all sexual contact. Why are these changes important? Taking steps to practice safe sex protects you and others. Many STIs can be cured. However, some STIs are not curable and will affect you for the rest of your life. STIs can be passed on to another person even if you do not have symptoms. What can happen if changes are not made? Certain STIs may:  Require you to take medicine for the rest of your life.  Affect your ability to have children (your fertility).  Increase your risk for developing another STI or certain serious health conditions, such as: ? Cervical cancer. ? Head and neck cancer. ? Pelvic inflammatory disease (PID) in women. ? Organ damage or damage to other parts of your body, if the infection spreads.  Be passed to a baby during childbirth. How are sexually transmitted infections treated? If you or your partner know or think that you may have an STI:  Talk with your health care provider about what can be done to treat it. Some STIs can be treated and cured with medicines.  For curable STIs, you and   your partner should avoid sex during treatment and for several days after treatment is complete.  You and your partner should both be treated at the same time, if there is any chance that your partner is infected as well. If you get treatment but your partner does not, your partner can re-infect you when you resume sexual contact.  Do not  have unprotected sex. Where to find more information Learn more about sexually transmitted diseases and infections from:  Centers for Disease Control and Prevention: ? More information about specific STIs: SolutionApps.co.za ? Find places to get sexual health counseling and treatment for free or for a low cost: gettested.TonerPromos.no  [place]. Department of Health and Human Services: NotebookPreviews.si.html Summary  The only way to completely prevent STIs is not to have sex (practice abstinence), including oral, vaginal, or anal sex.  STIs can spread through saliva, semen, blood, vaginal mucus, urine, or sexual contact.  If you do have sex, limit your number of sexual partners and use a barrier protection method every time you have sex.  If you develop an STI, get treated right away and ask your partner to be treated as well. Do not resume having sex until both of you have completed treatment for the STI. This information is not intended to replace advice given to you by your health care provider. Make sure you discuss any questions you have with your health care provider. Document Revised: 04/10/2019 Document Reviewed: 11/11/2016 Elsevier Patient Education  [employer]. Gonorrhea Gonorrhea is a sexually transmitted disease (STD) that can affect both men and women. If left untreated, this infection can:  Damage the female or female organs.  Cause women and men to be unable to have children (be sterile).  Harm a fetus if an infected woman is pregnant. It is important to get treatment for gonorrhea as soon as possible. It is also necessary for all of your sexual partners to be tested for the infection. What are the causes? This condition is caused by bacteria called Neisseria gonorrhoeae. The infection is spread from person to person through sexual contact, including oral, anal, and vaginal sex. A newborn can contract  the infection from his or her mother during birth. What increases the risk? The following factors may make you more likely to develop this condition:  Being a woman who is younger than 30 years of age and who is sexually active.  Being a woman 70 years of age or older who has: ? A new sex partner. ? More than one sex partner. ? A sex partner who has an STD.  Being a man who has: ? A new sex partner. ? More than one sex partner. ? A sex partner who has an STD.  Using condoms inconsistently.  Currently having, or having previously had, an STD.  Exchanging sex for money or drugs. What are the signs or symptoms? Some people do not have any symptoms. If you do have symptoms, they may be different for females and males. For females  Pain in the lower abdomen.  Abnormal vaginal discharge. The discharge may be cloudy, thick, or yellow-green in color.  Bleeding between periods.  Painful sex.  Burning or itching in and around the vagina.  Pain or burning when urinating.  Irritation, pain, bleeding, or discharge from the rectum. This may occur if the infection was spread by anal sex.  Sore throat or swollen lymph nodes in the neck. This may occur if the infection was spread by oral sex. For  males  Abnormal discharge from the penis. This discharge may be cloudy, thick, or yellow-green in color.  Pain or burning during urination.  Pain or swelling in the testicles.  Irritation, pain, bleeding, or discharge from the rectum. This may occur if the infection was spread by anal sex.  Sore throat, fever, or swollen lymph nodes in the neck. This may occur if the infection was spread by oral sex. How is this diagnosed? This condition is diagnosed based on:  A physical exam.  A sample of discharge that is examined under a microscope to look for the bacteria. The discharge may be taken from the urethra, cervix, throat, or rectum.  Urine tests. Not all of test results will be  available during your visit. How is this treated? This condition is treated with antibiotic medicines. It is important for treatment to begin as soon as possible. Early treatment may prevent some problems from developing. Do not have sex during treatment. Avoid all types of sexual activity for 7 days after treatment is complete and until any sex partners have been treated. Follow these instructions at home:  Take over-the-counter and prescription medicines only as told by your health care provider.  Take your antibiotic medicine as told by your health care provider. Do not stop taking the antibiotic even if you start to feel better.  Do not have sex until at least 7 days after you and your partner(s) have finished treatment and your health care provider says it is okay.  It is your responsibility to get your test results. Ask your health care provider, or the department performing the test, when your results will be ready.  If you test positive for gonorrhea, inform your recent sexual partners. This includes any oral, anal, or vaginal sex partners. They need to be checked for gonorrhea even if they do not have symptoms. They may need treatment, even if they test negative for gonorrhea.  Keep all follow-up visits as told by your health care provider. This is important. How is this prevented?   Use latex condoms correctly every time you have sexual intercourse.  Ask if your sexual partner has been tested for STDs and had negative results.  Avoid having multiple sexual partners. Contact a health care provider if:  You develop a bad reaction to the medicine you were prescribed. This may include: ? A rash. ? Nausea. ? Vomiting. ? Diarrhea.  Your symptoms do not get better after a few days of taking antibiotics.  Your symptoms get worse.  You develop new symptoms.  Your pain gets worse.  You have a fever.  You develop pain, itching, or discharge around the eyes. Get help right  away if:  You feel dizzy or faint.  You have trouble breathing or have shortness of breath.  You develop an irregular heartbeat.  You have severe abdominal pain with or without shoulder pain.  You develop any bumps or sores (lesions) on your skin.  You develop warmth, redness, pain, or swelling around your joints, such as the knee. Summary  Gonorrhea is an STD that can affect both men and women.  This condition is caused by bacteria called Neisseria gonorrhoeae. The infection is spread from person to person, usually through sexual contact, including oral, anal, and vaginal sex.  Symptoms vary between males and females. Generally, they include abnormal discharge and burning during urination. Women may also experience painful sex, itching around the vagina, and bleeding between menstrual periods. Men may also experience swelling of the  testicles.  This condition is treated with antibiotic medicines. Do not have sex until at least 7 days after completing antibiotic treatment.  If left untreated, gonorrhea can have serious side effects and complications. This information is not intended to replace advice given to you by your health care provider. Make sure you discuss any questions you have with your health care provider. Document Revised: 04/10/2019 Document Reviewed: 10/15/2016 Elsevier Patient Education  2020 [employer].

## 2020-06-16 ENCOUNTER — Ambulatory Visit (INDEPENDENT_AMBULATORY_CARE_PROVIDER_SITE_OTHER): Payer: No Typology Code available for payment source | Admitting: General Practice

## 2020-06-16 ENCOUNTER — Other Ambulatory Visit (HOSPITAL_COMMUNITY)
Admission: RE | Admit: 2020-06-16 | Discharge: 2020-06-16 | Disposition: A | Discharge status: Home / Self Care | Payer: No Typology Code available for payment source | Source: Ambulatory Visit | Attending: Obstetrics and Gynecology | Admitting: Obstetrics and Gynecology

## 2020-06-16 ENCOUNTER — Other Ambulatory Visit: Payer: Self-pay

## 2020-06-16 DIAGNOSIS — N898 Other specified noninflammatory disorders of vagina: Secondary | ICD-10-CM | POA: Insufficient documentation

## 2020-06-16 DIAGNOSIS — B379 Candidiasis, unspecified: Secondary | ICD-10-CM

## 2020-06-16 DIAGNOSIS — A749 Chlamydial infection, unspecified: Secondary | ICD-10-CM

## 2020-06-16 DIAGNOSIS — Z8619 Personal history of other infectious and parasitic diseases: Secondary | ICD-10-CM

## 2020-06-16 NOTE — Progress Notes (Signed)
Patient presents to office today for test of cure due to recent positive gonorrhea. Patient reports itching/irritation lately as well. Will order wet prep today. Patient instructed in self swab & specimen collected. Discussed results will be back in 24-48 hours and available in mychart along with any needed prescriptions sent in.  Chase Caller RN BSN 06/16/20

## 2020-06-16 NOTE — Progress Notes (Signed)
Patient was assessed and managed by nursing staff during this encounter. I have reviewed the chart and agree with the documentation and plan. I have also made any necessary editorial changes.  Warden Fillers, MD 06/16/2020 4:50 PM

## 2020-06-17 LAB — CERVICOVAGINAL ANCILLARY ONLY
Bacterial Vaginitis (gardnerella): NEGATIVE
Candida Glabrata: NEGATIVE
Candida Vaginitis: POSITIVE — AB
Chlamydia: POSITIVE — AB
Comment: NEGATIVE
Comment: NEGATIVE
Comment: NEGATIVE
Comment: NEGATIVE
Comment: NEGATIVE
Comment: NORMAL
Neisseria Gonorrhea: NEGATIVE
Trichomonas: NEGATIVE

## 2020-06-18 ENCOUNTER — Telehealth (INDEPENDENT_AMBULATORY_CARE_PROVIDER_SITE_OTHER): Payer: No Typology Code available for payment source | Admitting: Family Medicine

## 2020-06-18 DIAGNOSIS — B379 Candidiasis, unspecified: Secondary | ICD-10-CM

## 2020-06-18 DIAGNOSIS — Z712 Person consulting for explanation of examination or test findings: Secondary | ICD-10-CM

## 2020-06-18 DIAGNOSIS — A749 Chlamydial infection, unspecified: Secondary | ICD-10-CM

## 2020-06-18 NOTE — Telephone Encounter (Signed)
Patient called into the office wanting to speak to someone about her test results that she received from mychart. Patient instructed that the a nurse will be calling her back as soon as they can and a message will be sent to them. Patient verbalized understanding and message sent to clinical pool.

## 2020-06-20 MED ORDER — AZITHROMYCIN 250 MG PO TABS: ORAL_TABLET | ORAL | 0 refills | Status: DC

## 2020-06-20 MED ORDER — FLUCONAZOLE 150 MG PO TABS: 150.0000 mg | ORAL_TABLET | Freq: Once | ORAL | 0 refills | Status: AC

## 2020-06-20 NOTE — Addendum Note (Signed)
Addended by: Marjo Bicker on: 06/20/2020 09:26 AM   Modules accepted: Orders

## 2020-06-20 NOTE — Telephone Encounter (Addendum)
Called pt to follow up about chlamydia result. Zithromax 1 g per Anyanwu, MD sent to pt's preferred pharmacy and instructions given. Diflucan 150 mg sent for yeast infection. Pt instructed to not have intercourse for 2 weeks following treatment. Requested pt notify all sexual partners of need to be treated. Test of cure appt will be made for 3-4 weeks away.

## 2020-06-23 ENCOUNTER — Telehealth: Payer: Self-pay | Admitting: *Deleted

## 2020-06-23 NOTE — Telephone Encounter (Addendum)
-----   Message from Warden Fillers, MD sent at 06/17/2020 12:32 PM EDT ----- Yeast seen will treat with diflucan, positive chlamydia also noted, will treat with 1 gram azithromycin po and 500 mg ceftriaxone IM  7/26  1445  Called pt and left VM message stating that I am calling with test results and recommended treatment. Per chart review, pt does not check MyChart messages. She may call back if she has questions. Return message was also sent to Dr. Donavan Foil to confirm Tx with Ceftriaxone.   7/26 1450  Pt left message stating that she is returning a call.

## 2020-06-24 NOTE — Telephone Encounter (Signed)
Concerns addressed in other encounter; results and medication instructions have been given to pt.

## 2020-07-11 ENCOUNTER — Ambulatory Visit: Payer: No Typology Code available for payment source

## 2020-10-07 ENCOUNTER — Encounter: Payer: Self-pay | Admitting: *Deleted

## 2020-10-26 ENCOUNTER — Other Ambulatory Visit: Payer: Self-pay | Admitting: Obstetrics and Gynecology

## 2020-11-08 ENCOUNTER — Encounter (HOSPITAL_COMMUNITY): Payer: Self-pay | Admitting: *Deleted

## 2020-11-08 ENCOUNTER — Emergency Department (HOSPITAL_COMMUNITY)
Admission: EM | Admit: 2020-11-08 | Discharge: 2020-11-09 | Disposition: A | Discharge status: Home / Self Care | Payer: No Typology Code available for payment source | Attending: Emergency Medicine | Admitting: Emergency Medicine

## 2020-11-08 ENCOUNTER — Other Ambulatory Visit: Payer: Self-pay

## 2020-11-08 ENCOUNTER — Emergency Department (HOSPITAL_COMMUNITY): Payer: No Typology Code available for payment source

## 2020-11-08 DIAGNOSIS — R202 Paresthesia of skin: Secondary | ICD-10-CM | POA: Diagnosis not present

## 2020-11-08 DIAGNOSIS — F17210 Nicotine dependence, cigarettes, uncomplicated: Secondary | ICD-10-CM | POA: Insufficient documentation

## 2020-11-08 DIAGNOSIS — Q670 Congenital facial asymmetry: Secondary | ICD-10-CM | POA: Diagnosis not present

## 2020-11-08 LAB — BASIC METABOLIC PANEL
Anion gap: 13 (ref 5–15)
BUN: 12 mg/dL (ref 6–20)
CO2: 17 mmol/L — ABNORMAL LOW (ref 22–32)
Calcium: 8.8 mg/dL — ABNORMAL LOW (ref 8.9–10.3)
Chloride: 106 mmol/L (ref 98–111)
Creatinine, Ser: 0.64 mg/dL (ref 0.44–1.00)
GFR, Estimated: >60 mL/min (ref >60)
Glucose, Bld: 84 mg/dL (ref 70–99)
Potassium: 4 mmol/L (ref 3.5–5.1)
Sodium: 136 mmol/L (ref 135–145)

## 2020-11-08 LAB — CBC
HCT: 39.3 % (ref 36.0–46.0)
Hemoglobin: 13.4 g/dL (ref 12.0–15.0)
MCH: 29.8 pg (ref 26.0–34.0)
MCHC: 34.1 g/dL (ref 30.0–36.0)
MCV: 87.3 fL (ref 80.0–100.0)
Platelets: 400 10*3/uL (ref 150–400)
RBC: 4.5 MIL/uL (ref 3.87–5.11)
RDW: 14.8 % (ref 11.5–15.5)
WBC: 14.1 10*3/uL — ABNORMAL HIGH (ref 4.0–10.5)
nRBC: 0 % (ref 0.0–0.2)

## 2020-11-08 MED ORDER — OXYCODONE-ACETAMINOPHEN 5-325 MG PO TABS
2.0000 | ORAL_TABLET | Freq: Once | ORAL | Status: AC
  Administered 2020-11-08: 2 via ORAL
  Filled 2020-11-08: qty 2

## 2020-11-08 NOTE — ED Triage Notes (Addendum)
Pt has had a rt side  facial droop since Wednesday, numbness in feet for months. Tachycardic in triage.

## 2020-11-08 NOTE — ED Provider Notes (Signed)
[facility]-EMERGENCY DEPT Provider Note   CSN: [id number] Arrival date & time: 11/08/20  1508     History Chief Complaint  Patient presents with   Facial Droop   Numbness in feet    Barbara Barnett is a 29 y.o. female.  HPI    29 yo  Female with complaints of bilateral lower extremity tingling and pain for 6 weeks and right facial twisting for 3 days.  She feels her face intermittently pulls to the right and the right eyelid droops.  No visual changes, lateralized arm or leg weakness.  Seen at urgent care for the lower extremity pain and tingling and pain in September- patient seen and evaluated and prescribed medication and referred to podiatry.  Patient saw podiatry and referred to neurologist. LMP- November 8, nml period on nuvaring  History reviewed. No pertinent past medical history.  Patient Active Problem List   Diagnosis Date Noted   TOA (tubo-ovarian abscess) 05/03/2020   Peritonitis due to abscess (HCC) 04/28/2020    Past Surgical History:  Procedure Laterality Date   LAPAROSCOPIC APPENDECTOMY N/A 04/28/2020   Procedure: APPENDECTOMY LAPAROSCOPIC;  Surgeon: Manus Rudd, MD;  Location: [facility] OR;  Service: General;  Laterality: N/A;     OB History   No obstetric history on file.     Family History  Problem Relation Age of Onset   Diabetes type II Father    Stroke Father    Hypertension Father    Diabetes Paternal Uncle    Hypertension Paternal Uncle     Social History   Tobacco Use   Smoking status: Current Every Day Smoker    Packs/day: 0.50    Types: Cigarettes   Smokeless tobacco: Never Used  Substance Use Topics   Alcohol use: Yes   Drug use: Never    Home Medications Prior to Admission medications   Medication Sig Start Date End Date Taking? Authorizing Provider  acetaminophen (TYLENOL) 325 MG tablet Take 2 tablets (650 mg total) by mouth every 6 (six) hours as needed. Patient taking differently: Take  650 mg by mouth every 6 (six) hours as needed for mild pain.  04/30/20   Maczis, Elmer Sow, PA-C  amLODIPine-Valsartan-HCTZ [phone] MG TABS Take 1 tablet by mouth daily. Patient not taking: Reported on 05/26/2020 05/06/20   Constant, Peggy, MD  azithromycin (ZITHROMAX) 250 MG tablet Please take all 4 pills at once. 06/20/20   Anyanwu, Jethro Bastos, MD  doxycycline (VIBRA-TABS) 100 MG tablet Take 1 tablet (100 mg total) by mouth 2 (two) times daily. Patient not taking: Reported on 05/26/2020 04/30/20   Jacinto Halim, PA-C  ibuprofen (ADVIL) 600 MG tablet Take 1 tablet (600 mg total) by mouth every 6 (six) hours as needed (mild pain). Patient not taking: Reported on 05/26/2020 05/06/20   Constant, Peggy, MD  metroNIDAZOLE (FLAGYL) 500 MG tablet Take 1 tablet (500 mg total) by mouth 2 (two) times daily with a meal. DO NOT CONSUME ALCOHOL WHILE TAKING THIS MEDICATION. Patient not taking: Reported on 05/26/2020 04/30/20   Jacinto Halim, PA-C  ondansetron (ZOFRAN-ODT) 4 MG disintegrating tablet Take 1 tablet (4 mg total) by mouth every 6 (six) hours as needed for nausea. Patient not taking: Reported on 05/26/2020 04/30/20   Jacinto Halim, PA-C  oxyCODONE (OXY IR/ROXICODONE) 5 MG immediate release tablet Take 1 tablet (5 mg total) by mouth every 6 (six) hours as needed for breakthrough pain. Patient not taking: Reported on 05/26/2020 04/30/20   Leary Roca  M, PA-C    Allergies    Patient has no known allergies.  Review of Systems   Review of Systems  All other systems reviewed and are negative.   Physical Exam Updated Vital Signs BP 119/76 (BP Location: Left Arm)    Pulse 90    Temp 98 F (36.7 C) (Oral)    Resp 16    Ht 1.524 m (5')    Wt 60.3 kg    SpO2 99%    BMI 25.97 kg/m   Physical Exam Vitals and nursing note reviewed.  Constitutional:      General: She is not in acute distress.    Appearance: Normal appearance. She is not ill-appearing.  HENT:     Head: Normocephalic and atraumatic.      Right Ear: Tympanic membrane and external ear normal.     Left Ear: Tympanic membrane and external ear normal.     Nose: Nose normal.     Mouth/Throat:     Mouth: Mucous membranes are moist.     Pharynx: Oropharynx is clear.  Eyes:     Extraocular Movements: Extraocular movements intact.     Pupils: Pupils are equal, round, and reactive to light.  Cardiovascular:     Rate and Rhythm: Normal rate and regular rhythm.     Pulses: Normal pulses.     Heart sounds: Normal heart sounds.  Pulmonary:     Effort: Pulmonary effort is normal.     Breath sounds: Normal breath sounds.  Abdominal:     General: Abdomen is flat. Bowel sounds are normal.     Palpations: Abdomen is soft.  Musculoskeletal:        General: Normal range of motion.     Cervical back: Normal range of motion and neck supple. No rigidity or tenderness.  Skin:    General: Skin is warm and dry.     Capillary Refill: Capillary refill takes less than 2 seconds.  Neurological:     General: No focal deficit present.     Mental Status: She is alert and oriented to person, place, and time.     Cranial Nerves: No cranial nerve deficit.     Sensory: No sensory deficit.     Motor: No weakness.     Coordination: Coordination normal.     Gait: Gait normal.     Deep Tendon Reflexes: Reflexes normal.     Comments: Facial asymmetry noted but no droop appreciated with equal movement of mouth Eyebrows rise equally eom intact Eyes stay closed against resistance No visual field cut      ED Results / Procedures / Treatments   Labs (all labs ordered are listed, but only abnormal results are displayed) Labs Reviewed - No data to display  EKG None  Radiology No results found.  Procedures Procedures (including critical care time)  Medications Ordered in ED Medications - No data to display  ED Course  I have reviewed the triage vital signs and the nursing notes.  Pertinent labs & imaging results that were available during  my care of the patient were reviewed by me and considered in my medical decision making (see chart for details).    MDM Rules/Calculators/A&P                          Patient with complaint of lower extremity paresthesias and pain- symptoms present for 6 weeks and constant.  No neuro deficit appreciated on exam. Now with  new right sided facial pulling sensation, with pic showing ?left sided facial droop, patient states symptoms on right.  Exam normal here in ED.-?Bell's palsy but exam not consistent. CT head done without acute abnormalities.  Patient is scheduled to follow up with neurology op in January.  Will give referral and she can attempt to get earlier appointment.   Discussed return precautions and need for follow up. Patient signed out to Dr. Read Drivers.  He will disposition after labs return.  If no acute abnormality, plan discharge and referral to neurology.  Final Clinical Impression(s) / ED Diagnoses Final diagnoses:  Paresthesia of both lower extremities  Facial asymmetries    Rx / DC Orders ED Discharge Orders    None       Margarita Grizzle, MD 11/08/20 2306

## 2020-11-09 MED ORDER — CALCIUM CARBONATE ANTACID 500 MG PO CHEW
CHEWABLE_TABLET | ORAL | 0 refills | Status: DC
Start: 2020-11-09 — End: 2020-12-18

## 2020-11-09 MED ORDER — CALCIUM CARBONATE ANTACID 500 MG PO CHEW
800.0000 mg | CHEWABLE_TABLET | Freq: Once | ORAL | Status: AC
  Administered 2020-11-09: 800 mg via ORAL
  Filled 2020-11-09: qty 4

## 2020-11-09 NOTE — ED Provider Notes (Signed)
Nursing notes and vitals signs, including pulse oximetry, reviewed.  Summary of this visit's results, reviewed by myself:  EKG:  EKG Interpretation  Date/Time:    Ventricular Rate:    PR Interval:    QRS Duration:   QT Interval:    QTC Calculation:   R Axis:     Text Interpretation:         Labs:  Results for orders placed or performed during the hospital encounter of 11/08/20 (from the past 24 hour(s))  CBC     Status: Abnormal   Collection Time: 11/08/20  9:42 PM  Result Value Ref Range   WBC 14.1 (H) 4.0 - 10.5 K/uL   RBC 4.50 3.87 - 5.11 MIL/uL   Hemoglobin 13.4 12.0 - 15.0 g/dL   HCT 16.5 53.7 - 48.2 %   MCV 87.3 80.0 - 100.0 fL   MCH 29.8 26.0 - 34.0 pg   MCHC 34.1 30.0 - 36.0 g/dL   RDW 70.7 86.7 - 54.4 %   Platelets 400 150 - 400 K/uL   nRBC 0.0 0.0 - 0.2 %  Basic metabolic panel     Status: Abnormal   Collection Time: 11/08/20  9:42 PM  Result Value Ref Range   Sodium 136 135 - 145 mmol/L   Potassium 4.0 3.5 - 5.1 mmol/L   Chloride 106 98 - 111 mmol/L   CO2 17 (L) 22 - 32 mmol/L   Glucose, Bld 84 70 - 99 mg/dL   BUN 12 6 - 20 mg/dL   Creatinine, Ser 9.20 0.44 - 1.00 mg/dL   Calcium 8.8 (L) 8.9 - 10.3 mg/dL   GFR, Estimated >10 >07 mL/min   Anion gap 13 5 - 15    Imaging Studies: CT Head Wo Contrast  Result Date: 11/08/2020 CLINICAL DATA:  Suspected facial asymmetry. Neuro deficit, suspected acute stroke. EXAM: CT HEAD WITHOUT CONTRAST TECHNIQUE: Contiguous axial images were obtained from the base of the skull through the vertex without intravenous contrast. COMPARISON:  None FINDINGS: Brain: No evidence of large-territorial acute infarction. No parenchymal hemorrhage. No mass lesion. No extra-axial collection. No mass effect or midline shift. No hydrocephalus. Basilar cisterns are patent. Vascular: No hyperdense vessel. Skull: No acute fracture or focal lesion. Sinuses/Orbits: Paranasal sinuses and mastoid air cells are clear. The orbits are unremarkable.  Other: None. IMPRESSION: No acute intracranial abnormality. Electronically Signed   By: Tish Frederickson M.D.   On: 11/08/2020 22:07   12:07 AM Calcium is only slightly low but could be causing her symptoms.  We will have her take calcium supplements pending follow-up.     Hannelore Bova, Jonny Ruiz, MD 11/09/20 [phone]

## 2020-12-18 ENCOUNTER — Ambulatory Visit (INDEPENDENT_AMBULATORY_CARE_PROVIDER_SITE_OTHER): Payer: No Typology Code available for payment source | Admitting: Neurology

## 2020-12-18 ENCOUNTER — Encounter: Payer: Self-pay | Admitting: Neurology

## 2020-12-18 ENCOUNTER — Telehealth: Payer: Self-pay | Admitting: Neurology

## 2020-12-18 VITALS — BP 121/81 | HR 117 | Ht 60.0 in | Wt 131.5 lb

## 2020-12-18 DIAGNOSIS — R29810 Facial weakness: Secondary | ICD-10-CM | POA: Diagnosis not present

## 2020-12-18 DIAGNOSIS — R269 Unspecified abnormalities of gait and mobility: Secondary | ICD-10-CM | POA: Diagnosis not present

## 2020-12-18 DIAGNOSIS — R200 Anesthesia of skin: Secondary | ICD-10-CM

## 2020-12-18 MED ORDER — GABAPENTIN 300 MG PO CAPS: 300.0000 mg | ORAL_CAPSULE | Freq: Three times a day (TID) | ORAL | 2 refills | Status: DC

## 2020-12-18 NOTE — Progress Notes (Signed)
[facility]  PATIENT: Barbara Barnett DOB: 08/14/1991  REFERRING DOCTOR OR PCP:  Merwyn Katos, DPM SOURCE: Patient, notes from podiatry, notes from emergency room visit.  Laboratory and imaging results reviewed.  CT images personally reviewed.  _________________________________   HISTORICAL  CHIEF COMPLAINT:  Chief Complaint  Patient presents with  . New Patient (Initial Visit)    RM 13, alone. 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     HISTORY OF PRESENT ILLNESS:  I had the pleasure of seeing your patient, Barbara Barnett, at [facility] for neurologic consultation regarding her numbness in her limbs and facial asymmetry.  She is a 30 year old woman who has had a few months of numbness in her feet, bilaterally, that gradually increased to include the legs and abomen and lower chest over a few days.  There ws no numbness into the arms.   Compared to a few weeks ago, she feels it is unchanged.   The feet are still much more involved than the rest of the lower body.   The sensation is pins and nedles.    She notes no weakness in her legs.   She has not noted any change in her ladder.   She has not noted much change in balance or gait.      She had the onset of right facial weakness 11/07/2020,present when she woke up.   She had no mild numbness but no pain.   She noted no change in hearing or taste.    She is not sure if the forehead was involved.   This improved after 2 weeks and has resolved now.     She had no other episodes of numbness, weakness or clumsiness over the last few years.   She had an appendectomy 04/22/2020.      Currently, she still has numbness up to her breasts, much more inher feet than elsewhere.   Bending her neck cauases a n electric shock down her back.      Data: Imaging: CT scan 11/08/2020 is  normal.  Laboratory: CBC 11/08/2020 was normal.  BMP 11/08/2020 shows low potassium (2.8)  REVIEW OF SYSTEMS: Constitutional: No fevers, chills, sweats, or change in appetite Eyes: No visual changes, double vision, eye pain Ear, nose and throat: No hearing loss, ear pain, nasal congestion, sore throat Cardiovascular: No chest pain, palpitations Respiratory: No shortness of breath at rest or with exertion.   No wheezes GastrointestinaI: No nausea, vomiting, diarrhea, abdominal pain, fecal incontinence Genitourinary: No dysuria, urinary retention or frequency.  No nocturia. Musculoskeletal: No neck pain, back pain Integumentary: No rash, pruritus, skin lesions Neurological: as above Psychiatric: No depression at this time.  No anxiety Endocrine: No palpitations, diaphoresis, change in appetite, change in weigh or increased thirst Hematologic/Lymphatic: No anemia, purpura, petechiae. Allergic/Immunologic: No itchy/runny eyes, nasal congestion, recent allergic reactions, rashes  ALLERGIES: No Known Allergies  HOME MEDICATIONS:  Current Outpatient Medications:  .  etonogestrel-ethinyl estradiol (NUVARING) 0.12-0.015 MG/24HR vaginal ring, Place 1 each vaginally every 21 ( twenty-one) days., Disp: , Rfl:   PAST MEDICAL HISTORY: No past medical history on file.  PAST SURGICAL HISTORY: Past Surgical History:  Procedure Laterality Date  . LAPAROSCOPIC APPENDECTOMY N/A 04/28/2020   Procedure: APPENDECTOMY LAPAROSCOPIC;  Surgeon: Manus Rudd, MD;  Location: [facility] OR;  Service: General;  Laterality: N/A;    FAMILY HISTORY: Family History  Problem Relation Age of  Onset  . Diabetes type II Father   . Stroke Father   . Hypertension Father   . Diabetes Paternal Uncle   . Hypertension Paternal Uncle     SOCIAL HISTORY:  Social History   Socioeconomic History  . Marital status: Single    Spouse name: Not on file  . Number of children: 1  . Years of education: BA  . Highest  education level: Not on file  Occupational History  . Occupation: Community education officer  Tobacco Use  . Smoking status: Current Some Day Smoker    Types: Cigarettes, Cigars  . Smokeless tobacco: Never Used  . Tobacco comment: 1 cigar/socially (usually when she drinks)  Substance and Sexual Activity  . Alcohol use: Yes    Comment: 3 drinks per weekend  . Drug use: Never  . Sexual activity: Not on file  Other Topics Concern  . Not on file  Social History Narrative   Right handed    Lives alone with her son (6 y/o)-12/18/20   Caffeine use: none   Social Determinants of Corporate investment banker Strain: Not on file  Food Insecurity: Not on file  Transportation Needs: Not on file  Physical Activity: Not on file  Stress: Not on file  Social Connections: Not on file  Intimate Partner Violence: Not on file     PHYSICAL EXAM  Vitals:   12/18/20 1006  BP: 121/81  Pulse: (!) 117  SpO2: 98%  Weight: 131 lb 8 oz (59.6 kg)  Height: 5' (1.524 m)    Body mass index is 25.68 kg/m.   General: The patient is well-developed and well-nourished and in no acute distress  HEENT:  Head is [place]/AT.  Sclera are anicteric.  Funduscopic exam shows normal optic discs and retinal vessels.  Neck: No carotid bruits are noted.  The neck is nontender.  Cardiovascular: The heart has a regular rate and rhythm with a normal S1 and S2. There were no murmurs, gallops or rubs.    Skin: Extremities are without rash or  edema.  Musculoskeletal:  Back is nontender  Neurologic Exam  Mental status: The patient is alert and oriented x 3 at the time of the examination. The patient has apparent normal recent and remote memory, with an apparently normal attention span and concentration ability.   Speech is normal.  Cranial nerves: Extraocular movements are full. Pupils are equal, round, and reactive to light and accomodation.  Color vision is symmetric.  Facial symmetry is present. There is good facial sensation to soft  touch bilaterally.Facial strength is normal though very slight asymmetry of NLF.   Trapezius and sternocleidomastoid strength is normal. No dysarthria is noted.  The tongue is midline, and the patient has symmetric elevation of the soft palate. No obvious hearing deficits are noted.  Motor:  Muscle bulk is normal.   Tone is normal. Strength is  5 / 5 in all 4 extremities.   Sensory: Sensory testing is intact to pinprick, soft touch and vibration sensation in arms.   Mild reduced vibration in left leg vs. Right, mild reduced pinprick left plantar sensation vs. Right.   Tapping the ankle over the tarsal tunnel increase symptoms in the foot but tapping anywhere in the ankle or foot also did.  Coordination: Cerebellar testing reveals good finger-nose-finger and heel-to-shin bilaterally.  Gait and station: Station is normal.   Gait is normal. Tandem gait is wide.   Romberg is negative.   Reflexes: Deep tendon reflexes are symmetric and  normal bilaterally.   Plantar responses are flexor.    DIAGNOSTIC DATA (LABS, IMAGING, TESTING) - I reviewed patient records, labs, notes, testing and imaging myself where available.  Lab Results  Component Value Date   WBC 14.1 (H) 11/08/2020   HGB 13.4 11/08/2020   HCT 39.3 11/08/2020   MCV 87.3 11/08/2020   PLT 400 11/08/2020      Component Value Date/Time   NA 136 11/08/2020 2142   K 4.0 11/08/2020 2142   CL 106 11/08/2020 2142   CO2 17 (L) 11/08/2020 2142   GLUCOSE 84 11/08/2020 2142   BUN 12 11/08/2020 2142   CREATININE 0.64 11/08/2020 2142   CALCIUM 8.8 (L) 11/08/2020 2142   PROT 5.4 (L) 05/03/2020 0813   ALBUMIN 2.6 (L) 05/03/2020 0813   AST 11 (L) 05/03/2020 0813   ALT 16 05/03/2020 0813   ALKPHOS 44 05/03/2020 0813   BILITOT <0.1 (L) 05/03/2020 0813   GFRNONAA >60 11/08/2020 2142   GFRAA >60 05/03/2020 1753       ASSESSMENT AND PLAN  Numbness - Plan: MR BRAIN W WO CONTRAST, MR CERVICAL SPINE W WO CONTRAST, MR THORACIC SPINE W WO  CONTRAST  Gait disturbance - Plan: MR BRAIN W WO CONTRAST, MR CERVICAL SPINE W WO CONTRAST, MR THORACIC SPINE W WO CONTRAST  Facial weakness - Plan: MR BRAIN W WO CONTRAST   In summary, Ms. Nath is a 31 year old woman who had the fairly sudden onset of numbness starting in the feet several months ago that progressed over a few days to be from the chest down.  This has just slightly improved.  Additionally she had facial weakness on the right starting 5 weeks ago and lasting 2 weeks that has since improved.  The combination of the symptoms and her age makes me most concerned about multiple sclerosis as a possible diagnosis.  We need to check MRI of the brain, cervical spine and thoracic spine to better determine if she has abnormalities consistent with demyelination.  This will also allow [place] to assess for myelopathy and ischemic change..  I am less concerned about the possibility of tarsal tunnel syndrome.  However, if the MRIs are normal we will check an NCV/EMG of the legs to determine if there is polyneuropathy.  She is scheduled to return in 3 months for regular visit but based on the results of the imaging sooner follow-up might be scheduled.  She should call us if she has new or worsening neurologic symptoms.  Thank you for asking to see Ms. Aundria Rud.  Please let me know if I can be of further assistance with her or other patients in the future.     Andrienne Havener A. Epimenio Foot, MD, [facility] 12/18/2020, 10:26 AM Certified in Neurology, Clinical Neurophysiology, Sleep Medicine and Neuroimaging  [facility] [address] [place], [place] [address] [phone]

## 2020-12-18 NOTE — Telephone Encounter (Signed)
[employer] order sent to GI. They will obtain the auth and reach out to the patient to schedule.  

## 2021-01-01 ENCOUNTER — Ambulatory Visit
Admission: RE | Admit: 2021-01-01 | Discharge: 2021-01-01 | Disposition: A | Discharge status: Home / Self Care | Payer: No Typology Code available for payment source | Source: Ambulatory Visit | Attending: Neurology | Admitting: Neurology

## 2021-01-01 ENCOUNTER — Telehealth: Payer: Self-pay | Admitting: Neurology

## 2021-01-01 ENCOUNTER — Other Ambulatory Visit: Payer: Self-pay

## 2021-01-01 DIAGNOSIS — R269 Unspecified abnormalities of gait and mobility: Secondary | ICD-10-CM

## 2021-01-01 DIAGNOSIS — R29810 Facial weakness: Secondary | ICD-10-CM

## 2021-01-01 DIAGNOSIS — R200 Anesthesia of skin: Secondary | ICD-10-CM

## 2021-01-01 MED ORDER — PREDNISONE 50 MG PO TABS: ORAL_TABLET | ORAL | 0 refills | Status: DC

## 2021-01-01 MED ORDER — FAMOTIDINE 40 MG PO TABS
40.0000 mg | ORAL_TABLET | Freq: Two times a day (BID) | ORAL | 0 refills | Status: DC
Start: 2021-01-01 — End: 2021-04-06

## 2021-01-01 MED ORDER — GADOBENATE DIMEGLUMINE 529 MG/ML IV SOLN
13.0000 mL | Freq: Once | INTRAVENOUS | Status: AC | PRN
  Administered 2021-01-01: 13 mL via INTRAVENOUS

## 2021-01-01 NOTE — Telephone Encounter (Signed)
Called pt. Offered appt this upcoming Monday at 9am, she declined. Has MRI at 8am at [facility] imaging. Offered 230pm that same day. She declined, she has work. She requested Tues/Wed. Scheduled for Wed at 9am with MD. Advised her to check in by 830am. MD ok with this.  Reminded her that Dr. Epimenio Foot called in high dose oral steroid and pepcid for her to take. Went over directions with her.

## 2021-01-01 NOTE — Telephone Encounter (Signed)
MRI of the brain shows multiple T2/FLAIR hyperintense foci in the hemispheres and brainstem, many of which enhanced after contrast consistent with multiple sclerosis.  MRI of the cervical spine showed Foci at C5-C6 and T1-T2  I discussed the results with her and that they are consistent with multiple sclerosis.  I will want to talk to her in more detail and get her started on a disease modifying therapy.   I will call in a high-dose steroid (1200 mg prednisone daily for 4 days) and have her see me on Monday afternoon to go over treatment options

## 2021-01-05 ENCOUNTER — Telehealth: Payer: Self-pay | Admitting: *Deleted

## 2021-01-05 ENCOUNTER — Other Ambulatory Visit: Payer: Self-pay

## 2021-01-05 ENCOUNTER — Telehealth: Payer: Self-pay | Admitting: Neurology

## 2021-01-05 ENCOUNTER — Ambulatory Visit
Admission: RE | Admit: 2021-01-05 | Discharge: 2021-01-05 | Disposition: A | Discharge status: Home / Self Care | Payer: No Typology Code available for payment source | Source: Ambulatory Visit | Attending: Neurology | Admitting: Neurology

## 2021-01-05 ENCOUNTER — Encounter: Payer: Self-pay | Admitting: Neurology

## 2021-01-05 ENCOUNTER — Ambulatory Visit (INDEPENDENT_AMBULATORY_CARE_PROVIDER_SITE_OTHER): Payer: No Typology Code available for payment source | Admitting: Neurology

## 2021-01-05 VITALS — BP 143/91 | HR 60 | Ht 60.0 in | Wt 137.0 lb

## 2021-01-05 DIAGNOSIS — Z79899 Other long term (current) drug therapy: Secondary | ICD-10-CM

## 2021-01-05 DIAGNOSIS — R200 Anesthesia of skin: Secondary | ICD-10-CM

## 2021-01-05 DIAGNOSIS — R269 Unspecified abnormalities of gait and mobility: Secondary | ICD-10-CM

## 2021-01-05 DIAGNOSIS — G35 Multiple sclerosis: Secondary | ICD-10-CM

## 2021-01-05 MED ORDER — GADOBENATE DIMEGLUMINE 529 MG/ML IV SOLN
12.0000 mL | Freq: Once | INTRAVENOUS | Status: AC | PRN
  Administered 2021-01-05: 12 mL via INTRAVENOUS

## 2021-01-05 NOTE — Telephone Encounter (Signed)
Placed J[facility] lab in [facility] lock box for routine lab pick up. Results pending. 

## 2021-01-05 NOTE — Telephone Encounter (Signed)
Took call from phone staff and spoke with pt. Still having chest pain, SOB, back pain after starting oral prednisone this past friday afternoon. She did take 3 tabs this am. Per Dr. Epimenio Foot, ok to work pt in this afternoon. Pt accepted. Scheduled appt for 3pm. She will have to bring son with her to appt. He has not been exposed to anyone with covid/no signs/sx and will wear mask.

## 2021-01-05 NOTE — Telephone Encounter (Signed)
Pt called, having a reaction to predniSONE (DELTASONE) 50 MG tablet or famotidine (PEPCID) 40 MG tablet. Started taking medications on Friday, started having really bad chest pains, shortness of breath, pain going into my back.  Ask Pt if she needed to go to the ED, Pt stated, do not really want to go to the ED. I do not have any one to watch my son. Would like a call from the nurse.

## 2021-01-05 NOTE — Progress Notes (Signed)
[facility]  PATIENT: Barbara Barnett DOB: 1991-11-25  REFERRING DOCTOR OR PCP:  Merwyn Katos, DPM SOURCE: Patient, notes from podiatry, notes from emergency room visit.  Laboratory and imaging results reviewed.  CT images personally reviewed.  _________________________________   HISTORICAL  CHIEF COMPLAINT:  Chief Complaint  Patient presents with  . Follow-up    RM 12 with son. Here to discuss DMT options. Had SE from prednisone (oral)-CP, SOB    HISTORY OF PRESENT ILLNESS:  Barbara Barnett is a 30 year old woman with numbness just diagnosed with MS   She noted numbness in her feet, bilaterally, that gradually increased to include the legs and abomen and lower chest over a few days in September 2021.  There was no numbness into the arms initally but last week had the onset of left arm numbness.     The sensation is pins and nedles.    She notes no weakness in her legs.   She has not noted any change in her ladder.   She has not noted much change in balance or gait.    She had the onset of right facial weakness 11/07/2020, present when she woke up.   She had no mild numbness but no pain.   She noted no change in hearing or taste.    She is not sure if the forehead was involved.   This improved after 2 weeks.     I first saw her 12/18/2020.  On examination she had a mildly wide tandem gait and some altered sensation on the left.  MRIs of the brain and spine were performed to 01/01/2021 and they were consistent with MS..      Currently, she still has numbness up to her breasts,  more in her feet than elsewhere.   Bending her neck cauases an electric shock down her back.      MRIs of the brain and cervical spine 01/01/2021 were consistent with MS.  I sent in a high-dose oral steroid dose.  She took the medications Friday, Saturday and Sunday and several pills this morning.  Since yesterday,  she has had some chest pain and shortness of breath.   She has had insomnia in the  past several days.  She is having more pain in her back.   She is feeling dizzy since the weekend.   She also feels more forgetful.   She works for [employer] and is currently on the.  Data: Imaging: MRI of the brain 01/01/2021 shows multiple T2/FLAIR hyperintense foci in the hemispheres and brainstem consistent with MS.  MAny of the foci in the hemispheres and one at the pontomedullary junction enhanced after contrast.  Some of the enhancing foci in the hemispheres had mass-effect.  MRI of the cervical spine 01/01/2021 showed T2 hyperintense foci at C5-C6 and T1-T2.  They do not enhance.  MRI of the thoracic spine 01/05/2021 showed hyperintense foci within the spinal cord adjacent to T1-T2, T3, T6-T7 and T7-T8.  There is subtle enhancement of the focus adjacent to T7-T8..    Laboratory: CBC 11/08/2020 was normal.  BMP 11/08/2020 shows low potassium (2.8)  REVIEW OF SYSTEMS: Constitutional: No fevers, chills, sweats, or change in appetite Eyes: No visual changes, double vision, eye pain Ear, nose and throat: No hearing loss, ear pain, nasal congestion, sore throat Cardiovascular: No chest pain, palpitations Respiratory: No shortness of breath at rest or with exertion.   No wheezes GastrointestinaI: No nausea, vomiting, diarrhea, abdominal pain, fecal incontinence  Genitourinary: No dysuria, urinary retention or frequency.  No nocturia. Musculoskeletal: No neck pain, back pain Integumentary: No rash, pruritus, skin lesions Neurological: as above Psychiatric: No depression at this time.  No anxiety Endocrine: No palpitations, diaphoresis, change in appetite, change in weigh or increased thirst Hematologic/Lymphatic: No anemia, purpura, petechiae. Allergic/Immunologic: No itchy/runny eyes, nasal congestion, recent allergic reactions, rashes  ALLERGIES: No Known Allergies  HOME MEDICATIONS:  Current Outpatient Medications:  .  etonogestrel-ethinyl estradiol (NUVARING) 0.12-0.015 MG/24HR  vaginal ring, Place 1 each vaginally every 21 ( twenty-one) days., Disp: , Rfl:  .  gabapentin (NEURONTIN) 300 MG capsule, Take 1 capsule (300 mg total) by mouth 3 (three) times daily., Disp: 90 capsule, Rfl: 2 .  famotidine (PEPCID) 40 MG tablet, Take 1 tablet (40 mg total) by mouth 2 (two) times daily. (Patient not taking: Reported on 01/05/2021), Disp: 12 tablet, Rfl: 0 .  predniSONE (DELTASONE) 50 MG tablet, Take 8 pills po tid x 4 days. (24 pills/day)  Follow up with Dr. Epimenio Foot on Monday (Patient not taking: Reported on 01/05/2021), Disp: 96 tablet, Rfl: 0  PAST MEDICAL HISTORY: No past medical history on file.  PAST SURGICAL HISTORY: Past Surgical History:  Procedure Laterality Date  . LAPAROSCOPIC APPENDECTOMY N/A 04/28/2020   Procedure: APPENDECTOMY LAPAROSCOPIC;  Surgeon: Manus Rudd, MD;  Location: [facility] OR;  Service: General;  Laterality: N/A;    FAMILY HISTORY: Family History  Problem Relation Age of Onset  . Diabetes type II Father   . Stroke Father   . Hypertension Father   . Diabetes Paternal Uncle   . Hypertension Paternal Uncle     SOCIAL HISTORY:  Social History   Socioeconomic History  . Marital status: Single    Spouse name: Not on file  . Number of children: 1  . Years of education: BA  . Highest education level: Not on file  Occupational History  . Occupation: Community education officer  Tobacco Use  . Smoking status: Current Some Day Smoker    Types: Cigarettes, Cigars  . Smokeless tobacco: Never Used  . Tobacco comment: 1 cigar/socially (usually when she drinks)  Substance and Sexual Activity  . Alcohol use: Yes    Comment: 3 drinks per weekend  . Drug use: Never  . Sexual activity: Not on file  Other Topics Concern  . Not on file  Social History Narrative   Right handed    Lives alone with her son (6 y/o)-12/18/20   Caffeine use: none   Social Determinants of Corporate investment banker Strain: Not on file  Food Insecurity: Not on file  Transportation Needs: Not  on file  Physical Activity: Not on file  Stress: Not on file  Social Connections: Not on file  Intimate Partner Violence: Not on file     PHYSICAL EXAM  Vitals:   01/05/21 1441  BP: (!) 143/91  Pulse: 60  Weight: 137 lb (62.1 kg)  Height: 5' (1.524 m)    Body mass index is 26.76 kg/m.   General: The patient is well-developed and well-nourished and in no acute distress  HEENT:  Head is [place]/AT.  Sclera are anicteric.    Neck: No carotid bruits are noted.  The neck is nontender.  Cardiovascular/Lungs: The heart has a regular rate and rhythm with a normal S1 and S2. There were no murmurs, gallops or rubs.  Lungs are clear to auscultation.  Skin: Extremities are without rash or  edema.  Musculoskeletal:  Back is nontender  Neurologic Exam  Mental status: The patient is alert and oriented x 3 at the time of the examination. The patient has apparent normal recent and remote memory, with an apparently normal attention span and concentration ability.   Speech is normal.  Cranial nerves: Extraocular movements are full. There is good facial sensation to soft touch bilaterally.Facial strength is normal though very slight asymmetry of NLF.   Trapezius and sternocleidomastoid strength is normal. No dysarthria is noted.  The tongue is midline, and the patient has symmetric elevation of the soft palate. No obvious hearing deficits are noted.  Motor:  Muscle bulk is normal.   Tone is normal. Strength is  5 / 5 in all 4 extremities.   Sensory: Sensory testing shows reduced sensation    she has reduced touch, temperature and vibration sensation in the left arm and leg relative to the right.  This is worse in the leg than the arm.   Coordination: Cerebellar testing reveals good finger-nose-finger and slightly reduced left heel to shin    Gait and station: Station is normal.   Gait is slightly wide. Tandem gait is wide.   Romberg is negative.   Reflexes: Deep tendon reflexes are symmetric  and normal in arms and increased in legs with spread at left knee.  No ankle clonus .   Plantar responses are flexor.    DIAGNOSTIC DATA (LABS, IMAGING, TESTING) - I reviewed patient records, labs, notes, testing and imaging myself where available.  Lab Results  Component Value Date   WBC 14.1 (H) 11/08/2020   HGB 13.4 11/08/2020   HCT 39.3 11/08/2020   MCV 87.3 11/08/2020   PLT 400 11/08/2020      Component Value Date/Time   NA 136 11/08/2020 2142   K 4.0 11/08/2020 2142   CL 106 11/08/2020 2142   CO2 17 (L) 11/08/2020 2142   GLUCOSE 84 11/08/2020 2142   BUN 12 11/08/2020 2142   CREATININE 0.64 11/08/2020 2142   CALCIUM 8.8 (L) 11/08/2020 2142   PROT 5.4 (L) 05/03/2020 0813   ALBUMIN 2.6 (L) 05/03/2020 0813   AST 11 (L) 05/03/2020 0813   ALT 16 05/03/2020 0813   ALKPHOS 44 05/03/2020 0813   BILITOT <0.1 (L) 05/03/2020 0813   GFRNONAA >60 11/08/2020 2142   GFRAA >60 05/03/2020 1753       ASSESSMENT AND PLAN  Multiple sclerosis (HCC) - Plan: HIV Antibody (routine testing w rflx), QuantiFERON-TB Gold Plus, Hepatitis B surface antigen, Hepatitis B surface antibody,qualitative, Hepatitis B core antibody, total, Varicella zoster antibody, IgG, CBC with Differential/Platelet, Comprehensive metabolic panel, Stratify JCV Antibody Test ([facility])  High risk medication use - Plan: HIV Antibody (routine testing w rflx), QuantiFERON-TB Gold Plus, Hepatitis B surface antigen, Hepatitis B surface antibody,qualitative, Hepatitis B core antibody, total, Varicella zoster antibody, IgG, CBC with Differential/Platelet, Comprehensive metabolic panel, Stratify JCV Antibody Test ([facility])  Gait disturbance  Numbness  1.   I had a discussion with her and her mom (via phone) about the significance of the MRI findings.  Her history, exam and MRIs are consistent with multiple sclerosis.  I am concerned that she is presenting more aggressively with multiple enhancing lesions and multiple foci in the  spine and brainstem.  Therefore, I recommend that she initiate either Tysabri or Ocrevus in order to have the best likelihood of getting her MS under control.  We will check some lab work and decide among these 2 options after the JCV antibody returns. 2.   He was only able  to get 3 days of the high-dose oral steroids due to side effects.  Because she is feeling so poorly today she will not finish the titration. 3.   I will write her off of work until 02/02/2021 to allow her to recover and to initiate therapy. 4.   She will return to see me in 3 months or sooner if there are new or worsening neurologic symptoms.  45-minute office visit with the majority of the time spent face-to-face for history and physical, discussion/counseling and decision-making.  Additional time with record review and documentation.    Vern Prestia A. Epimenio Foot, MD, [facility] 01/05/2021, 6:33 PM Certified in Neurology, Clinical Neurophysiology, Sleep Medicine and Neuroimaging  [facility] [address], [address] [place], [place] [address] [phone]

## 2021-01-05 NOTE — Telephone Encounter (Signed)
LVM for pt letting her know Dr. Epimenio Foot received her message. Most likely coming from prednisone she took. She should go ahead and stop this if she has not already and keep her appt with Dr. Epimenio Foot for this Wed at 9am (check in 830am) to discuss next steps.  Asked her to call back. Dr. Epimenio Foot wants to know how much prednisone she took thus far.

## 2021-01-05 NOTE — Telephone Encounter (Signed)
Gave completed/signed FMLA back to medical records to process for pt. 

## 2021-01-07 ENCOUNTER — Ambulatory Visit: Payer: No Typology Code available for payment source | Admitting: Neurology

## 2021-01-07 LAB — CBC WITH DIFFERENTIAL/PLATELET
Basophils Absolute: 0 10*3/uL (ref 0.0–0.2)
Basos: 0 %
EOS (ABSOLUTE): 0 10*3/uL (ref 0.0–0.4)
Eos: 0 %
Hematocrit: 36.3 % (ref 34.0–46.6)
Hemoglobin: 12.5 g/dL (ref 11.1–15.9)
Immature Grans (Abs): 0.1 10*3/uL (ref 0.0–0.1)
Immature Granulocytes: 1 %
Lymphocytes Absolute: 0.8 10*3/uL (ref 0.7–3.1)
Lymphs: 6 %
MCH: 30.3 pg (ref 26.6–33.0)
MCHC: 34.4 g/dL (ref 31.5–35.7)
MCV: 88 fL (ref 79–97)
Monocytes Absolute: 0.8 10*3/uL (ref 0.1–0.9)
Monocytes: 6 %
Neutrophils Absolute: 12.9 10*3/uL — ABNORMAL HIGH (ref 1.4–7.0)
Neutrophils: 87 %
Platelets: 212 10*3/uL (ref 150–450)
RBC: 4.12 x10E6/uL (ref 3.77–5.28)
RDW: 14.7 % (ref 11.7–15.4)
WBC: 14.6 10*3/uL — ABNORMAL HIGH (ref 3.4–10.8)

## 2021-01-07 LAB — COMPREHENSIVE METABOLIC PANEL
ALT: 16 IU/L (ref 0–32)
AST: 16 IU/L (ref 0–40)
Albumin/Globulin Ratio: 1.9 (ref 1.2–2.2)
Albumin: 4.3 g/dL (ref 3.8–4.8)
Alkaline Phosphatase: 44 IU/L (ref 44–121)
BUN/Creatinine Ratio: 15 (ref 9–23)
BUN: 12 mg/dL (ref 6–20)
Bilirubin Total: <0.2 mg/dL (ref 0.0–1.2)
CO2: 20 mmol/L (ref 20–29)
Calcium: 9.7 mg/dL (ref 8.7–10.2)
Chloride: 106 mmol/L (ref 96–106)
Creatinine, Ser: 0.8 mg/dL (ref 0.57–1.00)
GFR calc Af Amer: 114 mL/min/{1.73_m2} (ref >59)
GFR calc non Af Amer: 99 mL/min/{1.73_m2} (ref >59)
Globulin, Total: 2.3 g/dL (ref 1.5–4.5)
Glucose: 159 mg/dL — ABNORMAL HIGH (ref 65–99)
Potassium: 3.9 mmol/L (ref 3.5–5.2)
Sodium: 143 mmol/L (ref 134–144)
Total Protein: 6.6 g/dL (ref 6.0–8.5)

## 2021-01-07 LAB — QUANTIFERON-TB GOLD PLUS
QuantiFERON Mitogen Value: 2.58 IU/mL
QuantiFERON Nil Value: 0 IU/mL
QuantiFERON TB1 Ag Value: 0 IU/mL
QuantiFERON TB2 Ag Value: 0 IU/mL
QuantiFERON-TB Gold Plus: NEGATIVE

## 2021-01-07 LAB — VARICELLA ZOSTER ANTIBODY, IGG: Varicella zoster IgG: 674 index (ref >165)

## 2021-01-07 LAB — HEPATITIS B SURFACE ANTIGEN: Hepatitis B Surface Ag: NEGATIVE

## 2021-01-07 LAB — HEPATITIS B CORE ANTIBODY, TOTAL: Hep B Core Total Ab: NEGATIVE

## 2021-01-07 LAB — HEPATITIS B SURFACE ANTIBODY,QUALITATIVE: Hep B Surface Ab, Qual: REACTIVE

## 2021-01-07 LAB — HIV ANTIBODY (ROUTINE TESTING W REFLEX): HIV Screen 4th Generation wRfx: NONREACTIVE

## 2021-01-12 ENCOUNTER — Telehealth: Payer: Self-pay | Admitting: *Deleted

## 2021-01-12 NOTE — Telephone Encounter (Signed)
JCV ab drawn on 01/05/21 positive, index: 3.34. Gave to MD for review.

## 2021-01-12 NOTE — Telephone Encounter (Signed)
Ocrevus start form completed and faxed to [employer], received fax confirmation.   Start form and patient notes given to Liane with [facility].

## 2021-01-12 NOTE — Telephone Encounter (Signed)
Called pt. Relayed results per Dr. Epimenio Foot. Advised we will send in start form. [facility] will work on approval and then call to get her scheduled once approved. She may have [employer] reach out/she may have to call them for financial assistance if needed. She verbalized understanding.

## 2021-01-12 NOTE — Telephone Encounter (Signed)
-----   Message from Asa Lente, MD sent at 01/12/2021  8:46 AM EST ----- She is JCV positive so will go on Ocrevus.  Please send in her form =and notify  [facility]

## 2021-01-15 ENCOUNTER — Telehealth: Payer: Self-pay | Admitting: Neurology

## 2021-01-15 DIAGNOSIS — Z0289 Encounter for other administrative examinations: Secondary | ICD-10-CM

## 2021-01-15 NOTE — Telephone Encounter (Signed)
Pt was called this morning to collect a fee for her "unum" disability form. Pt. Paid via phone on 01/15/2021. After the form conversation Pt. Asked if I could look to see when she would start infusions, I looked in the chart and saw where RN had sent her notes to the infusion suite. I offered to transfer pt to infusion to speak with them about it but she wanted to be sure they would answer her call and requested a "live transfer" where it was guaranteed her call would be answered so I informed her that we could not do guarantee but they are very good about answering the phone. Pt got upset and stated that I was not helpful and rude to her so she'd be letting Dr. Epimenio Foot know how rude I am at next appt.

## 2021-01-21 ENCOUNTER — Telehealth: Payer: Self-pay | Admitting: Neurology

## 2021-01-21 NOTE — Telephone Encounter (Signed)
Called pt. Advised per Graylon Gunning, RN w/ intrafusion: "It is in financial review to see if she will have any OOP. They received an approval letter late yesterday. I will call her as soon as I get the official approval to schedule her."   Pt wondering if she will have cost if OOP met. Advised I am not sure. She should be on look out for 800 number from [place], [place] (Pt Service department for [employer]) as they are the ones to be able to answer these questions for her. If she has high OOP, she will be screened for financial assistance at that point.  Also advised STD form completed. Scheduled next f/u for 04/06/21 at 1:30p w/ Dr. Felecia Shelling. She wanted to know more of a timeline of when she will be scheduled for infusion. Advised we will not know this until we know her OOP. We changed her return to work date from 02/02/21 to 02/09/21 to allow more time for scheduling/figure out plan of care. Pt verbalized understanding and appreciation.   I gave completed/signed STD form back to Hilda Blades to process for pt.

## 2021-01-21 NOTE — Telephone Encounter (Signed)
Unum form was faxed 01/12/2021

## 2021-02-03 NOTE — Telephone Encounter (Signed)
Patient had her first Ocrevus dose on January 27, 2021 and is scheduled for her second dose on February 05, 2021.

## 2021-02-05 ENCOUNTER — Other Ambulatory Visit: Payer: Self-pay | Admitting: Neurology

## 2021-02-10 ENCOUNTER — Other Ambulatory Visit: Payer: Self-pay | Admitting: Neurology

## 2021-02-10 MED ORDER — TRAZODONE HCL 50 MG PO TABS: ORAL_TABLET | ORAL | 3 refills | Status: DC

## 2021-03-11 NOTE — Telephone Encounter (Signed)
Gave completed/signed form back to medical records to process for pt. 

## 2021-03-16 ENCOUNTER — Telehealth: Payer: Self-pay | Admitting: *Deleted

## 2021-03-16 NOTE — Telephone Encounter (Signed)
Pt called request call from nurse having some medical issue. Please call [phone]

## 2021-03-16 NOTE — Telephone Encounter (Signed)
I faxed pt [facility] form on 03/11/21

## 2021-03-17 MED ORDER — LAMOTRIGINE 25 MG PO TABS: ORAL_TABLET | ORAL | 3 refills | Status: DC

## 2021-03-17 NOTE — Telephone Encounter (Signed)
Dr. Sater- what would you recommend? 

## 2021-03-17 NOTE — Telephone Encounter (Signed)
Called pt to further discuss. She reports ears feel clogged up/hard for her to hear. Anxiety levels are increased. I recommended she follow up with PCP about ear issue. May want to rule out any infection/sinus issues.   She also reports numbness/burning in mouth is still present and has continued to worsen. She states it started after her first infusion of Ocrevus on 01/27/21. She states it feels like she swallowed oragel. Worse on left side of her mouth. Wanting to know what Dr. Epimenio Barnett would recommend for this?  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 Has sharp shooting pain in back intermittently. She asked about completed forms for work. Barbara Barnett sent this back to her employer on 03/11/21. She will follow up w/ employer to ensure they received forms.

## 2021-03-17 NOTE — Telephone Encounter (Signed)
Spoke with Dr. Epimenio Foot. He recommends lamotrigine 25mg . Directions: Take 1 daily x5 days, then 1 twice a day for 5 days, then 1 three times a day for 5 days then take 2 tablets twice a day thereafter. #120, 3 refills  I called and LVM for pt to call office back.

## 2021-03-17 NOTE — Telephone Encounter (Signed)
Patient left a voicemail returning your call.  °

## 2021-03-17 NOTE — Telephone Encounter (Addendum)
Spoke with Dr. Epimenio Foot. He recommends having her increase her gabapentin from 300mg  po TID to 600mg  TID if she tolerated lower dose ok. If it makes her too sleepy, she can take 300mg  in the morning and afternoon and 600mg  at bedtime.  I called pt back. She stopped taking gabapentin. Advised it made her too sleepy and was ineffective. Advised I will speak with MD about alternative option and call back

## 2021-03-17 NOTE — Telephone Encounter (Signed)
Called pt back. Relayed Dr. Bonnita Hollow recommendation. She is agreeable to try this medication. She wrote down directions, read back correctly. Advised her to stop medication if she develops a rash and call our office to let us know. Once she reaches 2 tabs po BID, she should try being on this dose for at least 2 weeks to determine if it is effective or not. She verbalized understanding and appreciation.

## 2021-03-18 ENCOUNTER — Emergency Department (HOSPITAL_COMMUNITY)
Admission: EM | Admit: 2021-03-18 | Discharge: 2021-03-19 | Disposition: A | Discharge status: Home / Self Care | Payer: No Typology Code available for payment source | Attending: Emergency Medicine | Admitting: Emergency Medicine

## 2021-03-18 ENCOUNTER — Emergency Department (HOSPITAL_COMMUNITY): Payer: No Typology Code available for payment source

## 2021-03-18 ENCOUNTER — Other Ambulatory Visit: Payer: Self-pay

## 2021-03-18 ENCOUNTER — Encounter (HOSPITAL_COMMUNITY): Payer: Self-pay

## 2021-03-18 DIAGNOSIS — M25552 Pain in left hip: Secondary | ICD-10-CM | POA: Diagnosis not present

## 2021-03-18 DIAGNOSIS — M25512 Pain in left shoulder: Secondary | ICD-10-CM | POA: Insufficient documentation

## 2021-03-18 DIAGNOSIS — M7918 Myalgia, other site: Secondary | ICD-10-CM

## 2021-03-18 DIAGNOSIS — R079 Chest pain, unspecified: Secondary | ICD-10-CM | POA: Insufficient documentation

## 2021-03-18 DIAGNOSIS — F17210 Nicotine dependence, cigarettes, uncomplicated: Secondary | ICD-10-CM | POA: Diagnosis not present

## 2021-03-18 DIAGNOSIS — M791 Myalgia, unspecified site: Secondary | ICD-10-CM | POA: Diagnosis not present

## 2021-03-18 DIAGNOSIS — Y92410 Unspecified street and highway as the place of occurrence of the external cause: Secondary | ICD-10-CM | POA: Insufficient documentation

## 2021-03-18 LAB — POC URINE PREG, ED: Preg Test, Ur: NEGATIVE
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
## 2021-03-19 ENCOUNTER — Ambulatory Visit: Payer: No Typology Code available for payment source | Admitting: Neurology

## 2021-03-19 MED ORDER — IBUPROFEN 600 MG PO TABS: 600.0000 mg | ORAL_TABLET | Freq: Four times a day (QID) | ORAL | 0 refills | Status: DC | PRN

## 2021-03-19 MED ORDER — CYCLOBENZAPRINE HCL 10 MG PO TABS
10.0000 mg | ORAL_TABLET | Freq: Once | ORAL | Status: AC
  Administered 2021-03-19: 10 mg via ORAL
  Filled 2021-03-19: qty 1

## 2021-03-19 MED ORDER — HYDROCODONE-ACETAMINOPHEN 5-325 MG PO TABS: 1.0000 | ORAL_TABLET | Freq: Four times a day (QID) | ORAL | 0 refills | Status: DC | PRN

## 2021-03-19 MED ORDER — CYCLOBENZAPRINE HCL 10 MG PO TABS: 10.0000 mg | ORAL_TABLET | Freq: Two times a day (BID) | ORAL | 0 refills | Status: DC | PRN

## 2021-03-19 MED ORDER — IBUPROFEN 400 MG PO TABS
600.0000 mg | ORAL_TABLET | Freq: Once | ORAL | Status: AC
  Administered 2021-03-19: 600 mg via ORAL
  Filled 2021-03-19: qty 1
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
## 2021-03-19 NOTE — Discharge Instructions (Addendum)
Take medications as prescribed. Please return to the emergency department with any new or concerning symptoms.

## 2021-04-06 ENCOUNTER — Ambulatory Visit (INDEPENDENT_AMBULATORY_CARE_PROVIDER_SITE_OTHER): Payer: No Typology Code available for payment source | Admitting: Neurology

## 2021-04-06 ENCOUNTER — Other Ambulatory Visit: Payer: Self-pay

## 2021-04-06 ENCOUNTER — Encounter: Payer: Self-pay | Admitting: Neurology

## 2021-04-06 VITALS — BP 112/79 | HR 95 | Ht 60.0 in | Wt 137.5 lb

## 2021-04-06 DIAGNOSIS — G35 Multiple sclerosis: Secondary | ICD-10-CM

## 2021-04-06 DIAGNOSIS — R200 Anesthesia of skin: Secondary | ICD-10-CM

## 2021-04-06 DIAGNOSIS — R269 Unspecified abnormalities of gait and mobility: Secondary | ICD-10-CM

## 2021-04-06 DIAGNOSIS — Z79899 Other long term (current) drug therapy: Secondary | ICD-10-CM

## 2021-04-06 NOTE — Progress Notes (Signed)
[facility]  PATIENT: Barbara Barnett DOB: 1991/05/15  REFERRING DOCTOR OR PCP:  Merwyn Katos, DPM SOURCE: Patient, notes from podiatry, notes from emergency room visit.  Laboratory and imaging results reviewed.  CT images personally reviewed.  _________________________________   HISTORICAL  CHIEF COMPLAINT:  Chief Complaint  Patient presents with  . Follow-up    RM 11 w/ child. Last seen 01/05/2021. On Ocrevus 600mg  q 6 months for MS. Last infusion: 02/05/21, next: 08/20/21. Receives at [facility] w/ intrafusion. Lamotrigine helping much better than gabapentin.    HISTORY OF PRESENT ILLNESS:  Barbara Barnett is a 30 year old woman with numbness just diagnosed with MS  Update 04/06/2021: She had her last OCrevus infusion in March 2022 and next one is 08/20/21.   She tolerated it well.      She feels her numbness has done better.    Gabapentin was not helping te dysesthesias and she switched to lamotrigine which is helping more.   She notes more pain when standing a while.    She is walking better with less stumbling.  Facial numbness has resolved   She had noted numbness in her feet, bilaterally, that gradually increased to include the legs and abomen and lower chest over a few days in September 2021 and this is much better.   She notes no weakness in her legs.  Balance is slightly off.   She has not noted any change in her bladder.    Vision is ok with newer glasses.     MRIs of the brain and cervical spine 01/01/2021 were consistent with MS.  I sent in a high-dose oral steroid dose.  She took the medications Friday, Saturday and Sunday and several pills this morning.  Since yesterday,  she has had some chest pain and shortness of breath.   She has had insomnia in the past several days.  She is having more pain in her back.   She is feeling dizzy since the weekend.   She also feels more forgetful.   She works for Saturday and is currently on the.  Data: Imaging: MRI of the  brain 01/01/2021 shows multiple T2/FLAIR hyperintense foci in the hemispheres and brainstem consistent with MS.  MAny of the foci in the hemispheres and one at the pontomedullary junction enhanced after contrast.  Some of the enhancing foci in the hemispheres had mass-effect.  MRI of the cervical spine 01/01/2021 showed T2 hyperintense foci at C5-C6 and T1-T2.  They do not enhance.  MRI of the thoracic spine 01/05/2021 showed hyperintense foci within the spinal cord adjacent to T1-T2, T3, T6-T7 and T7-T8.  There is subtle enhancement of the focus adjacent to T7-T8..    Laboratory: CBC 11/08/2020 was normal.  BMP 11/08/2020 shows low potassium (2.8)  REVIEW OF SYSTEMS: Constitutional: No fevers, chills, sweats, or change in appetite Eyes: No visual changes, double vision, eye pain Ear, nose and throat: No hearing loss, ear pain, nasal congestion, sore throat Cardiovascular: No chest pain, palpitations Respiratory: No shortness of breath at rest or with exertion.   No wheezes GastrointestinaI: No nausea, vomiting, diarrhea, abdominal pain, fecal incontinence Genitourinary: No dysuria, urinary retention or frequency.  No nocturia. Musculoskeletal: No neck pain, back pain Integumentary: No rash, pruritus, skin lesions Neurological: as above Psychiatric: No depression at this time.  No anxiety Endocrine: No palpitations, diaphoresis, change in appetite, change in weigh or increased thirst Hematologic/Lymphatic: No anemia, purpura, petechiae. Allergic/Immunologic: No itchy/runny eyes, nasal congestion, recent allergic reactions,  rashes  ALLERGIES: No Known Allergies  HOME MEDICATIONS:  Current Outpatient Medications:  .  cyclobenzaprine (FLEXERIL) 10 MG tablet, Take 1 tablet (10 mg total) by mouth 2 (two) times daily as needed for muscle spasms., Disp: 20 tablet, Rfl: 0 .  etonogestrel-ethinyl estradiol (NUVARING) 0.12-0.015 MG/24HR vaginal ring, Place 1 each vaginally every 21 ( twenty-one)  days., Disp: , Rfl:  .  ibuprofen (ADVIL) 600 MG tablet, Take 1 tablet (600 mg total) by mouth every 6 (six) hours as needed., Disp: 30 tablet, Rfl: 0 .  lamoTRIgine (LAMICTAL) 25 MG tablet, Take 1 tablets daily x5 days, then 1 tablets twice a day for 5 days, then 1 tablet three times a day for 5 days, then take 2 tablets twice a day thereafter, Disp: 120 tablet, Rfl: 3 .  traZODone (DESYREL) 50 MG tablet, 1/2 to 1 pill po qHS, Disp: 90 tablet, Rfl: 3  PAST MEDICAL HISTORY: History reviewed. No pertinent past medical history.  PAST SURGICAL HISTORY: Past Surgical History:  Procedure Laterality Date  . LAPAROSCOPIC APPENDECTOMY N/A 04/28/2020   Procedure: APPENDECTOMY LAPAROSCOPIC;  Surgeon: Manus Rudd, MD;  Location: [facility] OR;  Service: General;  Laterality: N/A;    FAMILY HISTORY: Family History  Problem Relation Age of Onset  . Diabetes type II Father   . Stroke Father   . Hypertension Father   . Diabetes Paternal Uncle   . Hypertension Paternal Uncle     SOCIAL HISTORY:  Social History   Socioeconomic History  . Marital status: Single    Spouse name: Not on file  . Number of children: 1  . Years of education: BA  . Highest education level: Not on file  Occupational History  . Occupation: Community education officer  Tobacco Use  . Smoking status: Current Some Day Smoker    Types: Cigarettes, Cigars  . Smokeless tobacco: Never Used  . Tobacco comment: 1 cigar/socially (usually when she drinks)  Substance and Sexual Activity  . Alcohol use: Yes    Comment: 3 drinks per weekend  . Drug use: Never  . Sexual activity: Not on file  Other Topics Concern  . Not on file  Social History Narrative   Right handed    Lives alone with her son (6 y/o)-12/18/20   Caffeine use: none   Social Determinants of Corporate investment banker Strain: Not on file  Food Insecurity: Not on file  Transportation Needs: Not on file  Physical Activity: Not on file  Stress: Not on file  Social Connections:  Not on file  Intimate Partner Violence: Not on file     PHYSICAL EXAM  Vitals:   04/06/21 1302  BP: 112/79  Pulse: 95  Weight: 137 lb 8 oz (62.4 kg)  Height: 5' (1.524 m)    Body mass index is 26.85 kg/m.   General: The patient is well-developed and well-nourished and in no acute distress  HEENT:  Head is [place]/AT.  Sclera are anicteric.    Neck:   The neck is nontender.  Skin: Extremities are without rash or  edema.  Neurologic Exam  Mental status: The patient is alert and oriented x 3 at the time of the examination. The patient has apparent normal recent and remote memory, with an apparently normal attention span and concentration ability.   Speech is normal.  Cranial nerves: Extraocular movements are full. There is good facial sensation to soft touch bilaterally.Facial strength is normal though very slight asymmetry of NLF.   Trapezius and sternocleidomastoid strength  is normal. No dysarthria is noted.  The tongue is midline, and the patient has symmetric elevation of the soft palate. No obvious hearing deficits are noted.  Motor:  Muscle bulk is normal.   Tone is normal. Strength is  5 / 5 in all 4 extremities.   Sensory: Sensory testing shows normal vibration and touch sensation now (better)  Coordination: Cerebellar testing reveals good finger-nose-finger and slightly reduced left heel to shin    Gait and station: Station is normal.   Gait is minimally wide. Tandem gait is mildly wide.   Romberg is negative.   Reflexes: Deep tendon reflexes are symmetric and normal in arms and increased in legs with spread at left knee.  No ankle clonus .       DIAGNOSTIC DATA (LABS, IMAGING, TESTING) - I reviewed patient records, labs, notes, testing and imaging myself where available.  Lab Results  Component Value Date   WBC 14.6 (H) 01/05/2021   HGB 12.5 01/05/2021   HCT 36.3 01/05/2021   MCV 88 01/05/2021   PLT 212 01/05/2021      Component Value Date/Time   NA 143  01/05/2021 1551   K 3.9 01/05/2021 1551   CL 106 01/05/2021 1551   CO2 20 01/05/2021 1551   GLUCOSE 159 (H) 01/05/2021 1551   GLUCOSE 84 11/08/2020 2142   BUN 12 01/05/2021 1551   CREATININE 0.80 01/05/2021 1551   CALCIUM 9.7 01/05/2021 1551   PROT 6.6 01/05/2021 1551   ALBUMIN 4.3 01/05/2021 1551   AST 16 01/05/2021 1551   ALT 16 01/05/2021 1551   ALKPHOS 44 01/05/2021 1551   BILITOT <0.2 01/05/2021 1551   GFRNONAA 99 01/05/2021 1551   GFRNONAA >60 11/08/2020 2142   GFRAA 114 01/05/2021 1551       ASSESSMENT AND PLAN  Multiple sclerosis (HCC) - Plan: Ambulatory referral to Physical Therapy  High risk medication use  Gait disturbance - Plan: Ambulatory referral to Physical Therapy  Numbness  1.  Continue Ocrevus 2. Continue lamotrigine 3.  She will return to see me in 6 months or sooner if there are new or worsening neurologic symptoms.    Kyros Salzwedel A. Epimenio Foot, MD, [facility], [facility], [facility] 04/06/2021, 1:38 PM Certified in Neurology, Clinical Neurophysiology, Sleep Medicine and Neuroimaging  [facility] [address] [place], [place] [address] [phone]
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
## 2021-06-14 ENCOUNTER — Telehealth: Payer: No Typology Code available for payment source | Admitting: Physician Assistant

## 2021-06-14 DIAGNOSIS — N941 Unspecified dyspareunia: Secondary | ICD-10-CM

## 2021-06-14 NOTE — Progress Notes (Signed)
Based on what you shared with me, I feel your condition warrants further evaluation and I recommend that you be seen in a face to face visit. Giving mention of vaginal pain and pain on intercourse along with discharge, you need to have an in-person evaluation for exam and vaginal swabbing to confirm yeast versus other more serious cause of symptoms as yeast infections typically do not cause painful intercourse. It is our policy to recommend you get in-person evaluation to make sure nothing is missed and so the appropriate treatment is given.    NOTE: There will be NO CHARGE for this eVisit   If you are having a true medical emergency please call 911.      For an urgent face to face visit, [facility] has six urgent care centers for your convenience:     [facility] at [facility] Directions [phone] [address] [place], [place] [address]    [facility]) Get Driving Directions [phone] [address] [place], [place] [address]  [facility] Center [facility]) Get Driving Directions [phone] [address] [place],  [place]  [address]  [facility] Urgent Care at [facility] Get Driving Directions [phone] [address] [place], [place] [address]   [facility]gent Care at [facility] Get Driving Directions  [phone] [address] [place], [place] [address]   [facility] Health Urgent Care at [facility] Directions [phone] [address] [place], [place] [address]  Your MyChart E-visit questionnaire answers were reviewed by a board certified advanced clinical practitioner to complete your personal care plan based on your specific symptoms.  Thank you for using e-Visits.

## 2021-06-17 ENCOUNTER — Telehealth: Payer: Self-pay | Admitting: Neurology

## 2021-06-17 NOTE — Telephone Encounter (Signed)
FMLA forms completed and given to [facility] [employer] [facility]

## 2021-06-18 ENCOUNTER — Telehealth: Payer: Self-pay | Admitting: Neurology

## 2021-06-18 ENCOUNTER — Telehealth: Payer: Self-pay | Admitting: *Deleted

## 2021-06-18 NOTE — Telephone Encounter (Signed)
Called the patient since she asked for [place] to reach out through [place]. There was no answer and no VM.

## 2021-06-18 NOTE — Telephone Encounter (Signed)
Pt FMLA [employer] form faxed on 06/17/21

## 2021-06-23 NOTE — Telephone Encounter (Signed)
Pt returned call and I was able to advise the patient that the form had been completed and submitted to the company. Informed her I could also send the form in a email to her. Pt verbalized understanding and asked that I would do that. Confirmed the email that was on file. Pt verbalized understanding. Pt had no questions at this time but was encouraged to call back if questions arise.

## 2021-06-29 LAB — HM PAP SMEAR: HM Pap smear: ABNORMAL

## 2021-07-06 ENCOUNTER — Other Ambulatory Visit: Payer: Self-pay | Admitting: Neurology

## 2021-07-27 ENCOUNTER — Encounter: Payer: Self-pay | Admitting: Sports Medicine

## 2021-07-27 ENCOUNTER — Ambulatory Visit (INDEPENDENT_AMBULATORY_CARE_PROVIDER_SITE_OTHER): Payer: No Typology Code available for payment source | Admitting: Sports Medicine

## 2021-07-27 ENCOUNTER — Other Ambulatory Visit: Payer: Self-pay

## 2021-07-27 DIAGNOSIS — M7542 Impingement syndrome of left shoulder: Secondary | ICD-10-CM

## 2021-07-27 MED ORDER — MELOXICAM 15 MG PO TABS: ORAL_TABLET | ORAL | 3 refills | Status: DC

## 2021-07-27 NOTE — Progress Notes (Signed)
    Procedures performed today:    None.  Independent interpretation of notes and tests performed by another provider:   None.  Brief History, Exam, Impression, and Recommendations:    Impingement syndrome, shoulder, left This is a pleasant 30 year old female, she has a several month history of pain in her left shoulder, localized somewhat over the trapezius but worse with overhead activities. On exam she has a minimally positive Hawkins sign, positive Neer sign, positive O'Brien test. It sounds like she had been seeing Dr. Allison Quarry, she did have an MRI, sounds like she had a bursal sided supraspinatus tear.   In the meantime we will do meloxicam, formal physical therapy in [place], I would like to see her back in about 6 weeks and we will consider a subacromial injection if insufficiently better, considering that she has a bursal sided tear we may also do a glenohumeral joint.    ___________________________________________ Ihor Austin. Benjamin Stain, M.D., ABFM., CAQSM. Primary Care and Sports Medicine [facility]  Adjunct Instructor of Family Medicine  [employer]

## 2021-07-27 NOTE — Assessment & Plan Note (Signed)
This is a pleasant 30 year old female, she has a several month history of pain in her left shoulder, localized somewhat over the trapezius but worse with overhead activities. On exam she has a minimally positive Hawkins sign, positive Neer sign, positive O'Brien test. It sounds like she had been seeing Dr. Allison Quarry, she did have an MRI, sounds like she had a bursal sided supraspinatus tear.   In the meantime we will do meloxicam, formal physical therapy in [place] near [place], I would like to see her back in about 6 weeks and we will consider a subacromial injection if insufficiently better, considering that she has a bursal sided tear we may also do a glenohumeral joint.

## 2021-08-20 ENCOUNTER — Encounter: Payer: Self-pay | Admitting: Neurology

## 2021-09-07 ENCOUNTER — Ambulatory Visit (INDEPENDENT_AMBULATORY_CARE_PROVIDER_SITE_OTHER): Payer: No Typology Code available for payment source

## 2021-09-07 ENCOUNTER — Ambulatory Visit (INDEPENDENT_AMBULATORY_CARE_PROVIDER_SITE_OTHER): Payer: No Typology Code available for payment source | Admitting: Sports Medicine

## 2021-09-07 DIAGNOSIS — M7542 Impingement syndrome of left shoulder: Secondary | ICD-10-CM

## 2021-09-07 NOTE — Assessment & Plan Note (Addendum)
Several months of pain in the left shoulder, she localizes somewhat over the trapezius but worse with overhead activities, on exam she continues to have a positive Hawkins sign, Neer sign, O'Brien test. MRI was obtained by Dr. Allison Quarry and sounds like it showed a bursal sided supraspinatus tear. She has not done any physical therapy but has been working with Dr. Orvilla [place] office, I am going to transition her to home exercises, we injected her subacromial bursa today. She did have significant tenderness over the trapezius which raises the concern of a myofascial pain syndrome as well.

## 2021-09-07 NOTE — Progress Notes (Signed)
    Procedures performed today:    Procedure: Real-time Ultrasound Guided injection of the left subacromial bursa Device: Samsung HS60  Verbal informed consent obtained.  Time-out conducted.  Noted no overlying erythema, induration, or other signs of local infection.  Skin prepped in a sterile fashion.  Local anesthesia: Topical Ethyl chloride.  With sterile technique and under real time ultrasound guidance: Noted normal-appearing rotator cuff, 1 cc Kenalog 40, 1 cc lidocaine, 1 cc bupivacaine injected easily Completed without difficulty  Advised to call if fevers/chills, erythema, induration, drainage, or persistent bleeding.  Images permanently stored and available for review in PACS.  Impression: Technically successful ultrasound guided injection.  Independent interpretation of notes and tests performed by another provider:   None.  Brief History, Exam, Impression, and Recommendations:    Impingement syndrome, shoulder, left Several months of pain in the left shoulder, she localizes somewhat over the trapezius but worse with overhead activities, on exam she continues to have a positive Hawkins sign, Neer sign, O'Brien test. MRI was obtained by Dr. Allison Quarry and sounds like it showed a bursal sided supraspinatus tear.    ___________________________________________ Ihor Austin. Benjamin Stain, M.D., ABFM., CAQSM. Primary Care and Sports Medicine [facility]  Adjunct Instructor of Family Medicine  [employer]

## 2021-10-14 ENCOUNTER — Other Ambulatory Visit: Payer: Self-pay | Admitting: Neurology

## 2021-10-19 ENCOUNTER — Telehealth: Payer: Self-pay | Admitting: Neurology

## 2021-10-19 ENCOUNTER — Ambulatory Visit: Payer: No Typology Code available for payment source | Admitting: Neurology

## 2021-10-19 NOTE — Telephone Encounter (Signed)
Pt called had to reschedule her appt for today woke up with a really bad cold.

## 2021-10-20 ENCOUNTER — Ambulatory Visit (INDEPENDENT_AMBULATORY_CARE_PROVIDER_SITE_OTHER): Payer: No Typology Code available for payment source | Admitting: Sports Medicine

## 2021-10-20 ENCOUNTER — Ambulatory Visit (INDEPENDENT_AMBULATORY_CARE_PROVIDER_SITE_OTHER): Payer: No Typology Code available for payment source | Admitting: Medical-Surgical

## 2021-10-20 ENCOUNTER — Other Ambulatory Visit: Payer: Self-pay

## 2021-10-20 ENCOUNTER — Encounter: Payer: Self-pay | Admitting: Medical-Surgical

## 2021-10-20 VITALS — BP 136/96 | HR 83 | Temp 98.7°F | Resp 20 | Ht 60.0 in | Wt 135.6 lb

## 2021-10-20 DIAGNOSIS — B349 Viral infection, unspecified: Secondary | ICD-10-CM

## 2021-10-20 DIAGNOSIS — M7542 Impingement syndrome of left shoulder: Secondary | ICD-10-CM | POA: Diagnosis not present

## 2021-10-20 DIAGNOSIS — F418 Other specified anxiety disorders: Secondary | ICD-10-CM | POA: Diagnosis not present

## 2021-10-20 DIAGNOSIS — Z7689 Persons encountering health services in other specified circumstances: Secondary | ICD-10-CM

## 2021-10-20 DIAGNOSIS — F5104 Psychophysiologic insomnia: Secondary | ICD-10-CM

## 2021-10-20 DIAGNOSIS — G35 Multiple sclerosis: Secondary | ICD-10-CM | POA: Diagnosis not present

## 2021-10-20 LAB — POCT INFLUENZA A/B
Influenza A, POC: NEGATIVE
Influenza B, POC: NEGATIVE

## 2021-10-20 MED ORDER — HYDROXYZINE HCL 50 MG PO TABS: 25.0000 mg | ORAL_TABLET | Freq: Every evening | ORAL | 3 refills | Status: DC | PRN

## 2021-10-20 NOTE — Progress Notes (Signed)
    Procedures performed today:    None.  Independent interpretation of notes and tests performed by another provider:   None.  Brief History, Exam, Impression, and Recommendations:    Impingement syndrome, shoulder, left This is a pleasant 30 year old female, she had several months of pain that she localized in the left shoulder, over the trapezius but also worse with overhead activities, she had a positive Hawkins, Neer, O'Brien test on exam, she did get an MRI obtained by Dr. Allison Quarry that showed what looked like a bursal sided supraspinatus tear, we added home conditioning exercises and injected her subacromial bursa at the last visit and she returns today doing much better, she can return to see me as needed, on another note, due to her significant tenderness over the trapezius, shoulder girdle, hip girdle, there is likely some underlying myofascial pain syndrome as well that we may need to treat with one of the typical anticonvulsants or SNRIs. Otherwise return as needed.    ___________________________________________ Ihor Austin. Benjamin Stain, M.D., ABFM., CAQSM. Primary Care and Sports Medicine [facility]  Adjunct Instructor of Family Medicine  [employer]

## 2021-10-20 NOTE — Assessment & Plan Note (Signed)
This is a pleasant 30 year old female, she had several months of pain that she localized in the left shoulder, over the trapezius but also worse with overhead activities, she had a positive Hawkins, Neer, O'Brien test on exam, she did get an MRI obtained by Dr. Allison Quarry that showed what looked like a bursal sided supraspinatus tear, we added home conditioning exercises and injected her subacromial bursa at the last visit and she returns today doing much better, she can return to see me as needed, on another note, due to her significant tenderness over the trapezius, shoulder girdle, hip girdle, there is likely some underlying myofascial pain syndrome as well that we may need to treat with one of the typical anticonvulsants or SNRIs. Otherwise return as needed.

## 2021-10-20 NOTE — Progress Notes (Signed)
New Patient Office Visit  Subjective:  Patient ID: Barbara Barnett, female    DOB: [birth year]  Age: 30 y.o. MRN: [id number]  CC:  Chief Complaint  Patient presents with   Establish Care     HPI [name] presents to establish care.  She is a pleasant 30 year old female who is currently under the care of neurology.  She was diagnosed with multiple sclerosis this year and is currently taking lamotrigine 50 mg daily to help with her symptoms.  Notes this also helps with her mood since she has anxiety and depression issues.  She was also started on trazodone to help with insomnia issues however this does not seem to help her sleep at all.  She has meloxicam on hand for as needed use but only uses this infrequently.  Today reports 2 days of upper respiratory symptoms including sinus congestion, cough, sore throat, body aches, headache, and generalized malaise.  Symptoms are better today than they were yesterday.  She has tested COVID-negative.  Notes that she was babysitting her friend's son who was recently ill with similar symptoms.  Past Medical History:  Diagnosis Date   Anxiety 64   Comes and goes on bad days i have anxiety attacks   Depression 2015   It comes and goes   Multiple sclerosis [facility])     Past Surgical History:  Procedure Laterality Date   APPENDECTOMY     LAPAROSCOPIC APPENDECTOMY N/A 04/28/2020   Procedure: APPENDECTOMY LAPAROSCOPIC;  Surgeon: Manus Rudd, MD;  Location: [facility] OR;  Service: General;  Laterality: N/A;    Family History  Problem Relation Age of Onset   Diabetes type II Father    Stroke Father    Hypertension Father    Depression Father    Diabetes Father    Diabetes Paternal Uncle    Hypertension Paternal Uncle     Social History   Socioeconomic History   Marital status: Single    Spouse name: Not on file   Number of children: 1   Years of education: BA   Highest education level: Not on file  Occupational History   Occupation:  Community education officer  Tobacco Use   Smoking status: Former    Types: E-cigarettes   Smokeless tobacco: Never   Tobacco comments:    1 cigar/socially (usually when she drinks)  Vaping Use   Vaping Use: Every day  Substance and Sexual Activity   Alcohol use: Yes    Alcohol/week: 6.0 standard drinks    Types: 6 Shots of liquor per week    Comment: 3 drinks per weekend   Drug use: Never   Sexual activity: Yes    Birth control/protection: Condom, Inserts  Other Topics Concern   Not on file  Social History Narrative   Right handed    Lives alone with her son (6 y/o)-12/18/20   Caffeine use: none   Social Determinants of Corporate investment banker Strain: Not on file  Food Insecurity: Not on file  Transportation Needs: Not on file  Physical Activity: Not on file  Stress: Not on file  Social Connections: Not on file  Intimate Partner Violence: Not on file    ROS Review of Systems  Constitutional:  Positive for chills, fatigue and fever. Negative for unexpected weight change.  HENT:  Positive for congestion, postnasal drip, rhinorrhea, sinus pressure and sore throat.   Respiratory:  Negative for cough, chest tightness and shortness of breath.   Cardiovascular:  Negative for chest pain, palpitations  and leg swelling.  Gastrointestinal:  Negative for abdominal pain, constipation, diarrhea, nausea and vomiting.  Endocrine: Negative for cold intolerance and heat intolerance.  Genitourinary:  Negative for dysuria, frequency, urgency, vaginal bleeding and vaginal discharge.  Skin:  Negative for rash and wound.  Neurological:  Positive for headaches. Negative for dizziness and light-headedness.  Hematological:  Does not bruise/bleed easily.  Psychiatric/Behavioral:  Positive for dysphoric mood and sleep disturbance. Negative for self-injury and suicidal ideas. The patient is nervous/anxious.    Objective:   Today's Vitals: BP (!) 136/96 (BP Location: Left Arm, Patient Position: Sitting, Cuff  Size: Normal)   Pulse 83   Temp 98.7 F (37.1 C)   Resp 20   Ht 5' (1.524 m)   Wt 135 lb 9.6 oz (61.5 kg)   LMP 09/06/2021 (Exact Date)   SpO2 99%   BMI 26.48 kg/m   Physical Exam Vitals reviewed.  Constitutional:      General: She is not in acute distress.    Appearance: Normal appearance. She is not ill-appearing.  HENT:     Head: Normocephalic and atraumatic.     Right Ear: Tympanic membrane, ear canal and external ear normal. There is no impacted cerumen.     Left Ear: Tympanic membrane, ear canal and external ear normal. There is no impacted cerumen.     Nose: Nose normal.  Eyes:     General: No scleral icterus.       Right eye: No discharge.        Left eye: No discharge.     Pupils: Pupils are equal, round, and reactive to light.  Cardiovascular:     Rate and Rhythm: Normal rate and regular rhythm.     Pulses: Normal pulses.     Heart sounds: Normal heart sounds. No murmur heard.   No friction rub. No gallop.  Pulmonary:     Effort: Pulmonary effort is normal. No respiratory distress.     Breath sounds: Normal breath sounds. No wheezing.  Skin:    General: Skin is warm and dry.  Neurological:     Mental Status: She is alert and oriented to person, place, and time.  Psychiatric:        Mood and Affect: Mood normal.        Behavior: Behavior normal.        Thought Content: Thought content normal.        Judgment: Judgment normal.    Assessment & Plan:   1. Encounter to establish care Reviewed available information and discussed care concerns with patient.   2. Viral illness POCT influenza negative.  Discussed symptomatic treatment of viral illnesses.  Continue over-the-counter cold and flu preparations and consider other conservative measures such as warm tea with honey, throat lozenges, cool mist humidifier, etc. - POCT Influenza A/B  3. Multiple sclerosis (HCC) Managed by neurology.  4. Anxiety with depression Continue Lamictal as instructed by  neurology.  Symptoms are somewhat improved now so we will hold off on any medication changes.  5.  Psychophysiological insomnia Since trazodone is not helping, will try hydroxyzine 25-50 mg nightly as needed for sleep.  Outpatient Encounter Medications as of 10/20/2021  Medication Sig   etonogestrel-ethinyl estradiol (NUVARING) 0.12-0.015 MG/24HR vaginal ring Place 1 each vaginally every 21 ( twenty-one) days.   lamoTRIgine (LAMICTAL) 25 MG tablet Take 25 mg by mouth daily.   meloxicam (MOBIC) 15 MG tablet One tab PO qAM with a meal for 2 weeks, then daily prn  pain.   traZODone (DESYREL) 50 MG tablet 1/2 to 1 pill po qHS   No facility-administered encounter medications on file as of 10/20/2021.    Follow-up: Return for annual physical exam at your convenience.   Thayer Ohm, DNP, APRN, FNP-BC [facility] and Sports Medicine

## 2021-11-09 ENCOUNTER — Other Ambulatory Visit: Payer: Self-pay | Admitting: Neurology

## 2021-12-02 ENCOUNTER — Other Ambulatory Visit: Payer: Self-pay | Admitting: Sports Medicine

## 2021-12-02 DIAGNOSIS — M7542 Impingement syndrome of left shoulder: Secondary | ICD-10-CM

## 2021-12-16 ENCOUNTER — Encounter: Payer: No Typology Code available for payment source | Admitting: Medical-Surgical

## 2021-12-17 ENCOUNTER — Ambulatory Visit (INDEPENDENT_AMBULATORY_CARE_PROVIDER_SITE_OTHER): Payer: No Typology Code available for payment source | Admitting: Medical-Surgical

## 2021-12-17 ENCOUNTER — Encounter: Payer: Self-pay | Admitting: Medical-Surgical

## 2021-12-17 ENCOUNTER — Other Ambulatory Visit: Payer: Self-pay

## 2021-12-17 VITALS — BP 132/87 | HR 98 | Resp 20 | Ht 60.0 in | Wt 138.0 lb

## 2021-12-17 DIAGNOSIS — N760 Acute vaginitis: Secondary | ICD-10-CM | POA: Diagnosis not present

## 2021-12-17 DIAGNOSIS — Z131 Encounter for screening for diabetes mellitus: Secondary | ICD-10-CM | POA: Diagnosis not present

## 2021-12-17 DIAGNOSIS — Z0000 Encounter for general adult medical examination without abnormal findings: Secondary | ICD-10-CM

## 2021-12-17 DIAGNOSIS — Z1329 Encounter for screening for other suspected endocrine disorder: Secondary | ICD-10-CM

## 2021-12-17 DIAGNOSIS — Z0001 Encounter for general adult medical examination with abnormal findings: Secondary | ICD-10-CM

## 2021-12-17 LAB — WET PREP FOR TRICH, YEAST, CLUE
MICRO NUMBER:: 12892115
Specimen Quality: ADEQUATE

## 2021-12-17 MED ORDER — FLUCONAZOLE 150 MG PO TABS: 150.0000 mg | ORAL_TABLET | Freq: Once | ORAL | 1 refills | Status: AC

## 2021-12-17 NOTE — Patient Instructions (Signed)
Preventive Care 21-32 Years Old, Female °Preventive care refers to lifestyle choices and visits with your health care provider that can promote health and wellness. Preventive care visits are also called wellness exams. °What can I expect for my preventive care visit? °Counseling °During your preventive care visit, your health care provider may ask about your: °Medical history, including: °Past medical problems. °Family medical history. °Pregnancy history. °Current health, including: °Menstrual cycle. °Method of birth control. °Emotional well-being. °Home life and relationship well-being. °Sexual activity and sexual health. °Lifestyle, including: °Alcohol, nicotine or tobacco, and drug use. °Access to firearms. °Diet, exercise, and sleep habits. °Work and work environment. °Sunscreen use. °Safety issues such as seatbelt and bike helmet use. °Physical exam °Your health care provider may check your: °Height and weight. These may be used to calculate your BMI (body mass index). BMI is a measurement that tells if you are at a healthy weight. °Waist circumference. This measures the distance around your waistline. This measurement also tells if you are at a healthy weight and may help predict your risk of certain diseases, such as type 2 diabetes and high blood pressure. °Heart rate and blood pressure. °Body temperature. °Skin for abnormal spots. °What immunizations do I need? °Vaccines are usually given at various ages, according to a schedule. Your health care provider will recommend vaccines for you based on your age, medical history, and lifestyle or other factors, such as travel or where you work. °What tests do I need? °Screening °Your health care provider may recommend screening tests for certain conditions. This may include: °Pelvic exam and Pap test. °Lipid and cholesterol levels. °Diabetes screening. This is done by checking your blood sugar (glucose) after you have not eaten for a while (fasting). °Hepatitis B  test. °Hepatitis C test. °HIV (human immunodeficiency virus) test. °STI (sexually transmitted infection) testing, if you are at risk. °BRCA-related cancer screening. This may be done if you have a family history of breast, ovarian, tubal, or peritoneal cancers. °Talk with your health care provider about your test results, treatment options, and if necessary, the need for more tests. °Follow these instructions at home: °Eating and drinking ° °Eat a healthy diet that includes fresh fruits and vegetables, whole grains, lean protein, and low-fat dairy products. °Take vitamin and mineral supplements as recommended by your health care provider. °Do not drink alcohol if: °Your health care provider tells you not to drink. °You are pregnant, may be pregnant, or are planning to become pregnant. °If you drink alcohol: °Limit how much you have to 0-1 drink a day. °Know how much alcohol is in your drink. In the [place] one drink equals one 12 oz bottle of beer (355 mL), one 5 oz glass of wine (148 mL), or one 1½ oz glass of hard liquor (44 mL). °Lifestyle °Brush your teeth every morning and night with fluoride toothpaste. Floss one time each day. °Exercise for at least 30 minutes 5 or more days each week. °Do not use any products that contain nicotine or tobacco. These products include cigarettes, chewing tobacco, and vaping devices, such as e-cigarettes. If you need help quitting, ask your health care provider. °Do not use drugs. °If you are sexually active, practice safe sex. Use a condom or other form of protection to prevent STIs. °If you do not wish to become pregnant, use a form of birth control. If you plan to become pregnant, see your health care provider for a prepregnancy visit. °Find healthy ways to manage stress, such as: °Meditation, yoga,   or listening to music. °Journaling. °Talking to a trusted person. °Spending time with friends and family. °Minimize exposure to UV radiation to reduce your risk of skin  cancer. °Safety °Always wear your seat belt while driving or riding in a vehicle. °Do not drive: °If you have been drinking alcohol. Do not ride with someone who has been drinking. °If you have been using any mind-altering substances or drugs. °While texting. °When you are tired or distracted. °Wear a helmet and other protective equipment during sports activities. °If you have firearms in your house, make sure you follow all gun safety procedures. °Seek help if you have been physically or sexually abused. °What's next? °Go to your health care provider once a year for an annual wellness visit. °Ask your health care provider how often you should have your eyes and teeth checked. °Stay up to date on all vaccines. °This information is not intended to replace advice given to you by your health care provider. Make sure you discuss any questions you have with your health care provider. °Document Revised: 05/13/2021 Document Reviewed: 05/13/2021 °Elsevier Patient Education © 2022 Elsevier Inc. ° °

## 2021-12-17 NOTE — Progress Notes (Signed)
Medical screening examination/treatment was performed by qualified clinical staff member and as supervising physician I was immediately available for consultation/collaboration. I have reviewed documentation and agree with assessment and plan. ° °Khasir Woodrome L. Ardyn Forge, DNP, APRN, FNP-BC °[facility] °Primary Care and Sports Medicine ° °

## 2021-12-17 NOTE — Addendum Note (Signed)
Addended bySamuel Bouche on: 12/17/2021 01:36 PM   Modules accepted: Orders

## 2021-12-17 NOTE — Progress Notes (Signed)
HPI: Barbara Barnett is a 31 y.o. female who  has a past medical history of Anxiety (2015), Depression (2015), and Multiple sclerosis ([place]).  she presents to [facility] today, 12/17/21,  for chief complaint of: Annual physical exam  Dentist: not UTD, finding new dentist Eye exam: UTD, wears glasses Exercise: no current routine  Diet: No dietary restrictions Pap smear: UTD COVID vaccine: UTD  Concerns: Was seen at the urgent care in December for vaginal itching, d/c and odor. Was treated with Metronidazole  for BV and thought it got better. Noticed two weeks after treatment that she was having itching and "cottage cheese" like discharge. Denies odor or bleeding. Sexually active with two female partners at the time and had a sexual encounter without condom use. Otherwise uses condoms. States the itching is more internal and periodic in occurrence. States she would also like to be tested for HSV. States these symptoms are worsening her anxiety and would just like to make sure everything is ok.   Past medical, surgical, social and family history reviewed:  Patient Active Problem List   Diagnosis Date Noted   Impingement syndrome, shoulder, left 07/27/2021   Numbness 12/18/2020   Gait disturbance 12/18/2020   Facial weakness 12/18/2020   TOA (tubo-ovarian abscess) 05/03/2020   Peritonitis due to abscess ([place]) 04/28/2020    Past Surgical History:  Procedure Laterality Date   APPENDECTOMY     LAPAROSCOPIC APPENDECTOMY N/A 04/28/2020   Procedure: APPENDECTOMY LAPAROSCOPIC;  Surgeon: Donnie Mesa, MD;  Location: [place];  Service: General;  Laterality: N/A;    Social History   Tobacco Use   Smoking status: Former    Types: E-cigarettes   Smokeless tobacco: Never   Tobacco comments:    1 cigar/socially (usually when she drinks)  Substance Use Topics   Alcohol use: Yes    Alcohol/week: 6.0 standard drinks    Types: 6 Shots of liquor per week     Comment: 3 drinks per weekend    Family History  Problem Relation Age of Onset   Diabetes type II Father    Stroke Father    Hypertension Father    Depression Father    Diabetes Father    Diabetes Paternal Uncle    Hypertension Paternal Uncle      Current medication list and allergy/intolerance information reviewed:    Current Outpatient Medications  Medication Sig Dispense Refill   etonogestrel-ethinyl estradiol (NUVARING) 0.12-0.015 MG/24HR vaginal ring Place 1 each vaginally every 21 ( twenty-one) days.     hydrOXYzine (ATARAX/VISTARIL) 50 MG tablet Take 0.5-1 tablets (25-50 mg total) by mouth at bedtime as needed. 90 tablet 3   lamoTRIgine (LAMICTAL) 25 MG tablet TAKE 2 TABLETS BY MOUTH 2 TIMES DAILY. 360 tablet 0   meloxicam (MOBIC) 15 MG tablet ONE TAB BY MOUTH EVERY MORNING WITH A MEAL FOR 2 WEEKS, THEN DAILY AS NEEDED FOR PAIN. 30 tablet 3   traZODone (DESYREL) 50 MG tablet 1/2 to 1 pill po qHS 90 tablet 3   No current facility-administered medications for this visit.    No Known Allergies    Review of Systems: Constitutional:  No  fever, no chills, No recent illness, No unintentional weight changes. No significant fatigue.  HEENT: No  headache, no vision change, no hearing change, No sore throat, No  sinus pressure Cardiac: No  chest pain, No  pressure, No palpitations, No  Orthopnea Respiratory:  No  shortness of breath. No  Cough Gastrointestinal: No  abdominal pain, No  nausea, No  vomiting,  No  blood in stool, No  diarrhea, No  constipation  Musculoskeletal: No new myalgia/arthralgia Skin: No  Rash, No other wounds/concerning lesions Genitourinary: No  incontinence, No  abnormal genital bleeding, Positive for itching and cottage cheese like discharge  Hem/Onc: No  easy bruising/bleeding, No  abnormal lymph node Endocrine: No cold intolerance,  No heat intolerance. No polyuria/polydipsia/polyphagia  Neurologic: No  weakness, No  dizziness, No  slurred  speech/focal weakness/facial droop Psychiatric: No  concerns with depression, Positive for anxiety, No sleep problems, No mood problems  Exam:  BP 132/87 (BP Location: Left Arm, Patient Position: Sitting, Cuff Size: Normal)    Pulse 98    Resp 20    Ht 5' (1.524 m)    Wt 62.6 kg    SpO2 100%    BMI 26.95 kg/m  Constitutional: VS see above. General Appearance: alert, well-developed, well-nourished, NAD Eyes: Normal lids and conjunctive, non-icteric sclera Ears, Nose, Mouth, Throat: MMM, Normal external inspection ears/nares/mouth/lips/gums. TM normal bilaterally.   Neck: No masses, trachea midline. No thyroid enlargement. No tenderness/mass appreciated. No lymphadenopathy Respiratory: Normal respiratory effort. no wheeze, no rhonchi, no rales Cardiovascular: S1/S2 normal, no murmur, no rub/gallop auscultated. RRR. No lower extremity edema. Pedal pulse II/IV bilaterally DP and PT. No carotid bruit or JVD. No abdominal aortic bruit. Gastrointestinal: no masses. No hepatomegaly, no splenomegaly. No hernia appreciated. Bowel sounds normal. Rectal exam deferred. Mild suprapubic tenderness present. Musculoskeletal: Gait normal. No clubbing/cyanosis of digits.  Neurological: Normal balance/coordination. No tremor. No cranial nerve deficit on limited exam. Motor and sensation intact and symmetric. Cerebellar reflexes intact.  Skin: warm, dry, intact. No rash/ulcer. No concerning nevi or subq nodules on limited exam.   Psychiatric: Normal judgment/insight. Normal mood and affect. Oriented x3.    ASSESSMENT/PLAN:   1. Acute vaginitis Discussed checking wet prep to assess for yeast/BV associated with symptoms. Pt would like HSV test to make sure. Recommend not washing vaginal area with soaps, no douching, and recommend condom use with each encounter. Recommend cotton underwear until symptoms subside.  - HSV(herpes simplex vrs) 1+2 ab-IgG - WET PREP FOR TRICH, YEAST, CLUE  2. Annual physical  exam Routine lab work today - Lipid panel - COMPLETE METABOLIC PANEL WITH GFR - CBC with Differential/Platelet  3. Thyroid disorder screen Checking  labs today - TSH  4. Diabetes mellitus screening Checking labs today - Hemoglobin A1c   Orders Placed This Encounter  Procedures   WET PREP FOR TRICH, YEAST, CLUE   TSH   Lipid panel   COMPLETE METABOLIC PANEL WITH GFR   CBC with Differential/Platelet   Hemoglobin A1c   HSV(herpes simplex vrs) 1+2 ab-IgG    No orders of the defined types were placed in this encounter.   Patient Instructions  Preventive Care 35-21 Years Old, Female Preventive care refers to lifestyle choices and visits with your health care provider that can promote health and wellness. Preventive care visits are also called wellness exams. What can I expect for my preventive care visit? Counseling During your preventive care visit, your health care provider may ask about your: Medical history, including: Past medical problems. Family medical history. Pregnancy history. Current health, including: Menstrual cycle. Method of birth control. Emotional well-being. Home life and relationship well-being. Sexual activity and sexual health. Lifestyle, including: Alcohol, nicotine or tobacco, and drug use. Access to firearms. Diet, exercise, and sleep habits. Work and work Statistician. Sunscreen use. Safety issues such as seatbelt  and bike helmet use. Physical exam Your health care provider may check your: Height and weight. These may be used to calculate your BMI (body mass index). BMI is a measurement that tells if you are at a healthy weight. Waist circumference. This measures the distance around your waistline. This measurement also tells if you are at a healthy weight and may help predict your risk of certain diseases, such as type 2 diabetes and high blood pressure. Heart rate and blood pressure. Body temperature. Skin for abnormal spots. What  immunizations do I need? Vaccines are usually given at various ages, according to a schedule. Your health care provider will recommend vaccines for you based on your age, medical history, and lifestyle or other factors, such as travel or where you work. What tests do I need? Screening Your health care provider may recommend screening tests for certain conditions. This may include: Pelvic exam and Pap test. Lipid and cholesterol levels. Diabetes screening. This is done by checking your blood sugar (glucose) after you have not eaten for a while (fasting). Hepatitis B test. Hepatitis C test. HIV (human immunodeficiency virus) test. STI (sexually transmitted infection) testing, if you are at risk. BRCA-related cancer screening. This may be done if you have a family history of breast, ovarian, tubal, or peritoneal cancers. Talk with your health care provider about your test results, treatment options, and if necessary, the need for more tests. Follow these instructions at home: Eating and drinking  Eat a healthy diet that includes fresh fruits and vegetables, whole grains, lean protein, and low-fat dairy products. Take vitamin and mineral supplements as recommended by your health care provider. Do not drink alcohol if: Your health care provider tells you not to drink. You are pregnant, may be pregnant, or are planning to become pregnant. If you drink alcohol: Limit how much you have to 0-1 drink a day. Know how much alcohol is in your drink. In the [place] one drink equals one 12 oz bottle of beer (355 mL), one 5 oz glass of wine (148 mL), or one 1 oz glass of hard liquor (44 mL). Lifestyle Brush your teeth every morning and night with fluoride toothpaste. Floss one time each day. Exercise for at least 30 minutes 5 or more days each week. Do not use any products that contain nicotine or tobacco. These products include cigarettes, chewing tobacco, and vaping devices, such as e-cigarettes. If you  need help quitting, ask your health care provider. Do not use drugs. If you are sexually active, practice safe sex. Use a condom or other form of protection to prevent STIs. If you do not wish to become pregnant, use a form of birth control. If you plan to become pregnant, see your health care provider for a prepregnancy visit. Find healthy ways to manage stress, such as: Meditation, yoga, or listening to music. Journaling. Talking to a trusted person. Spending time with friends and family. Minimize exposure to UV radiation to reduce your risk of skin cancer. Safety Always wear your seat belt while driving or riding in a vehicle. Do not drive: If you have been drinking alcohol. Do not ride with someone who has been drinking. If you have been using any mind-altering substances or drugs. While texting. When you are tired or distracted. Wear a helmet and other protective equipment during sports activities. If you have firearms in your house, make sure you follow all gun safety procedures. Seek help if you have been physically or sexually abused. What's next? Go  to your health care provider once a year for an annual wellness visit. Ask your health care provider how often you should have your eyes and teeth checked. Stay up to date on all vaccines. This information is not intended to replace advice given to you by your health care provider. Make sure you discuss any questions you have with your health care provider. Document Revised: 05/13/2021 Document Reviewed: 05/13/2021 Elsevier Patient Education  [place].   Follow-up plan: Return in about 1 year (around 12/17/2022) for annual physical exam.  Jeanann Lewandowsky, Student NP

## 2021-12-18 ENCOUNTER — Encounter: Payer: Self-pay | Admitting: Medical-Surgical

## 2021-12-18 LAB — CBC WITH DIFFERENTIAL/PLATELET
Absolute Monocytes: 546 cells/uL (ref 200–950)
Basophils Absolute: 9 cells/uL (ref 0–200)
Basophils Relative: 0.1 %
Eosinophils Absolute: 62 cells/uL (ref 15–500)
Eosinophils Relative: 0.7 %
HCT: 39.1 % (ref 35.0–45.0)
Hemoglobin: 13.2 g/dL (ref 11.7–15.5)
Lymphs Abs: 563 cells/uL — ABNORMAL LOW (ref 850–3900)
MCH: 29.7 pg (ref 27.0–33.0)
MCHC: 33.8 g/dL (ref 32.0–36.0)
MCV: 87.9 fL (ref 80.0–100.0)
MPV: 9.8 fL (ref 7.5–12.5)
Monocytes Relative: 6.2 %
Neutro Abs: 7621 cells/uL (ref 1500–7800)
Neutrophils Relative %: 86.6 %
Platelets: 350 10*3/uL (ref 140–400)
RBC: 4.45 10*6/uL (ref 3.80–5.10)
RDW: 14.5 % (ref 11.0–15.0)
Total Lymphocyte: 6.4 %
WBC: 8.8 10*3/uL (ref 3.8–10.8)

## 2021-12-18 LAB — COMPLETE METABOLIC PANEL WITH GFR
AG Ratio: 1.8 (calc) (ref 1.0–2.5)
ALT: 8 U/L (ref 6–29)
AST: 12 U/L (ref 10–30)
Albumin: 4.2 g/dL (ref 3.6–5.1)
Alkaline phosphatase (APISO): 43 U/L (ref 31–125)
BUN: 7 mg/dL (ref 7–25)
CO2: 24 mmol/L (ref 20–32)
Calcium: 9 mg/dL (ref 8.6–10.2)
Chloride: 109 mmol/L (ref 98–110)
Creat: 0.76 mg/dL (ref 0.50–0.97)
Globulin: 2.4 g/dL (calc) (ref 1.9–3.7)
Glucose, Bld: 93 mg/dL (ref 65–99)
Potassium: 4 mmol/L (ref 3.5–5.3)
Sodium: 140 mmol/L (ref 135–146)
Total Bilirubin: 0.4 mg/dL (ref 0.2–1.2)
Total Protein: 6.6 g/dL (ref 6.1–8.1)
eGFR: 107 mL/min/{1.73_m2} (ref >=60)

## 2021-12-18 LAB — LIPID PANEL
Cholesterol: 138 mg/dL (ref <200)
HDL: 76 mg/dL (ref >=50)
LDL Cholesterol (Calc): 44 mg/dL (calc)
Non-HDL Cholesterol (Calc): 62 mg/dL (calc) (ref <130)
Total CHOL/HDL Ratio: 1.8 (calc) (ref <5.0)
Triglycerides: 101 mg/dL (ref <150)

## 2021-12-18 LAB — HEMOGLOBIN A1C
Hgb A1c MFr Bld: 5.2 % of total Hgb (ref <5.7)
Mean Plasma Glucose: 103 mg/dL
eAG (mmol/L): 5.7 mmol/L

## 2021-12-18 LAB — TSH: TSH: 0.63 mIU/L

## 2021-12-18 LAB — HSV(HERPES SIMPLEX VRS) I + II AB-IGG
HAV 1 IGG,TYPE SPECIFIC AB: 5.27 index — ABNORMAL HIGH
HSV 2 IGG,TYPE SPECIFIC AB: <0.9 index

## 2021-12-21 ENCOUNTER — Encounter: Payer: Self-pay | Admitting: Neurology

## 2021-12-21 ENCOUNTER — Ambulatory Visit (INDEPENDENT_AMBULATORY_CARE_PROVIDER_SITE_OTHER): Payer: No Typology Code available for payment source | Admitting: Neurology

## 2021-12-21 VITALS — BP 120/78 | HR 87 | Ht 60.0 in | Wt 137.0 lb

## 2021-12-21 DIAGNOSIS — R269 Unspecified abnormalities of gait and mobility: Secondary | ICD-10-CM

## 2021-12-21 DIAGNOSIS — Z79899 Other long term (current) drug therapy: Secondary | ICD-10-CM | POA: Diagnosis not present

## 2021-12-21 DIAGNOSIS — G35 Multiple sclerosis: Secondary | ICD-10-CM

## 2021-12-21 DIAGNOSIS — R208 Other disturbances of skin sensation: Secondary | ICD-10-CM

## 2021-12-21 DIAGNOSIS — R200 Anesthesia of skin: Secondary | ICD-10-CM | POA: Diagnosis not present

## 2021-12-21 MED ORDER — LAMOTRIGINE 25 MG PO TABS: 50.0000 mg | ORAL_TABLET | Freq: Two times a day (BID) | ORAL | 3 refills | Status: DC

## 2021-12-21 NOTE — Progress Notes (Signed)
[facility]  PATIENT: Barbara Barnett DOB: 06-17-1991  REFERRING DOCTOR OR PCP:  Inocencio Homes, DPM SOURCE: Patient, notes from podiatry, notes from emergency room visit.  Laboratory and imaging results reviewed.  CT images personally reviewed.  _________________________________   HISTORICAL  CHIEF COMPLAINT:  Chief Complaint  Patient presents with   Follow-up    Rm 1, alone. Here for 6 month MS f/u, on Ocrevus and tolerating well. Last infusion: 08/20/2021, Next infusion: 03/04/2022. Pt does get MS flare ups, for a day or two. Tingling in hands and feet's. Lamotrigine helps. Overall doing well.     HISTORY OF PRESENT ILLNESS:  Barbara Barnett is a 31 y.o. woman with numbness just diagnosed with MS  Update 12/21/2021 She had her last OCrevus infusion in  08/20/21 and next one should be around March   She tolerated it well.      She has fluctuating numbness in hands and feet but nothing dramatically new.    She switched from gabapentin to lamotrigine.   She feel sit has helped more. The more severe numbness noted in September 2021 has resolved.   She notes no weakness in her legs.  Balance is slightly off and for safey she holds the bannister on stairs.   She can walk > 1 mile but not as far as before MS.   She has not noted any change in her bladder.    Vision is ok with newer glasses.     MRIs of the brain and cervical spine 01/01/2021 were consistent with MS.  Specifically, she had supratentorial and infratentorial lesions, some enhancing and  6 foci in spinal cord -- the one at T7-T8 enhancing.    Data: Imaging: MRI of the brain 01/01/2021 shows multiple T2/FLAIR hyperintense foci in the hemispheres and brainstem consistent with MS.  MAny of the foci in the hemispheres and one at the pontomedullary junction enhanced after contrast.  Some of the enhancing foci in the hemispheres had mass-effect.  MRI of the cervical spine 01/01/2021 showed T2 hyperintense foci at C5-C6  and T1-T2.  They do not enhance.  MRI of the thoracic spine 01/05/2021 showed hyperintense foci within the spinal cord adjacent to T1-T2, T3, T6-T7 and T7-T8.  There is subtle enhancement of the focus adjacent to T7-T8..    Laboratory: CBC 11/08/2020 was normal.  BMP 11/08/2020 shows low potassium (2.8)  REVIEW OF SYSTEMS: Constitutional: No fevers, chills, sweats, or change in appetite Eyes: No visual changes, double vision, eye pain Ear, nose and throat: No hearing loss, ear pain, nasal congestion, sore throat Cardiovascular: No chest pain, palpitations Respiratory:  No shortness of breath at rest or with exertion.   No wheezes GastrointestinaI: No nausea, vomiting, diarrhea, abdominal pain, fecal incontinence Genitourinary:  No dysuria, urinary retention or frequency.  No nocturia. Musculoskeletal:  No neck pain, back pain Integumentary: No rash, pruritus, skin lesions Neurological: as above Psychiatric: No depression at this time.  No anxiety Endocrine: No palpitations, diaphoresis, change in appetite, change in weigh or increased thirst Hematologic/Lymphatic:  No anemia, purpura, petechiae. Allergic/Immunologic: No itchy/runny eyes, nasal congestion, recent allergic reactions, rashes  ALLERGIES: No Known Allergies  HOME MEDICATIONS:  Current Outpatient Medications:    etonogestrel-ethinyl estradiol (NUVARING) 0.12-0.015 MG/24HR vaginal ring, Place 1 each vaginally every 21 ( twenty-one) days., Disp: , Rfl:    hydrOXYzine (ATARAX/VISTARIL) 50 MG tablet, Take 0.5-1 tablets (25-50 mg total) by mouth at bedtime as needed., Disp: 90 tablet, Rfl: 3   meloxicam (MOBIC)  15 MG tablet, ONE TAB BY MOUTH EVERY MORNING WITH A MEAL FOR 2 WEEKS, THEN DAILY AS NEEDED FOR PAIN., Disp: 30 tablet, Rfl: 3   lamoTRIgine (LAMICTAL) 25 MG tablet, Take 2 tablets (50 mg total) by mouth 2 (two) times daily., Disp: 360 tablet, Rfl: 3  PAST MEDICAL HISTORY: Past Medical History:  Diagnosis Date   Anxiety  45   Comes and goes on bad days i have anxiety attacks   Depression 2015   It comes and goes   Multiple sclerosis ([place])     PAST SURGICAL HISTORY: Past Surgical History:  Procedure Laterality Date   APPENDECTOMY     LAPAROSCOPIC APPENDECTOMY N/A 04/28/2020   Procedure: APPENDECTOMY LAPAROSCOPIC;  Surgeon: Donnie Mesa, MD;  Location: [place];  Service: General;  Laterality: N/A;    FAMILY HISTORY: Family History  Problem Relation Age of Onset   Diabetes type II Father    Stroke Father    Hypertension Father    Depression Father    Diabetes Father    Diabetes Paternal Uncle    Hypertension Paternal Uncle     SOCIAL HISTORY:  Social History   Socioeconomic History   Marital status: Single    Spouse name: Not on file   Number of children: 1   Years of education: BA   Highest education level: Not on file  Occupational History   Occupation: Airline pilot  Tobacco Use   Smoking status: Former    Types: E-cigarettes   Smokeless tobacco: Never   Tobacco comments:    1 cigar/socially (usually when she drinks)  Vaping Use   Vaping Use: Every day  Substance and Sexual Activity   Alcohol use: Yes    Alcohol/week: 6.0 standard drinks    Types: 6 Shots of liquor per week    Comment: 3 drinks per weekend   Drug use: Never   Sexual activity: Yes    Birth control/protection: Condom, Inserts  Other Topics Concern   Not on file  Social History Narrative   Right handed    Lives alone with her son (6 y/o)-12/18/20   Caffeine use: none   Social Determinants of Radio broadcast assistant Strain: Not on file  Food Insecurity: Not on file  Transportation Needs: Not on file  Physical Activity: Not on file  Stress: Not on file  Social Connections: Not on file  Intimate Partner Violence: Not on file     PHYSICAL EXAM  Vitals:   12/21/21 0942  BP: 120/78  Pulse: 87  SpO2: 98%  Weight: 137 lb (62.1 kg)  Height: 5' (1.524 m)    Body mass index is 26.76  kg/m.   General: The patient is well-developed and well-nourished and in no acute distress  HEENT:  Head is [place]/AT.  Sclera are anicteric.    Neck:   The neck is nontender.  Skin: Extremities are without rash or  edema.  Neurologic Exam  Mental status: The patient is alert and oriented x 3 at the time of the examination. The patient has apparent normal recent and remote memory, with an apparently normal attention span and concentration ability.   Speech is normal.  Cranial nerves: Extraocular movements are full. Normal facial strength and sensation  . No obvious hearing deficits are noted.  Motor:  Muscle bulk is normal.   Tone is normal. Strength is  5 / 5 in all 4 extremities.   Sensory: Sensory testing shows normal vibration and touch sensation in arms and legs  Coordination: Cerebellar testing reveals good finger-nose-finger and slightly reduced left heel to shin    Gait and station: Station is normal.   The gait was normal.  Tandem gait was mildly wide.  Romberg is negative.  Reflexes: Deep tendon reflexes are symmetric and normal in arms and legs.  No ankle clonus .       DIAGNOSTIC DATA (LABS, IMAGING, TESTING) - I reviewed patient records, labs, notes, testing and imaging myself where available.  Lab Results  Component Value Date   WBC 8.8 12/17/2021   HGB 13.2 12/17/2021   HCT 39.1 12/17/2021   MCV 87.9 12/17/2021   PLT 350 12/17/2021      Component Value Date/Time   NA 140 12/17/2021 0000   NA 143 01/05/2021 1551   K 4.0 12/17/2021 0000   CL 109 12/17/2021 0000   CO2 24 12/17/2021 0000   GLUCOSE 93 12/17/2021 0000   BUN 7 12/17/2021 0000   BUN 12 01/05/2021 1551   CREATININE 0.76 12/17/2021 0000   CALCIUM 9.0 12/17/2021 0000   PROT 6.6 12/17/2021 0000   PROT 6.6 01/05/2021 1551   ALBUMIN 4.3 01/05/2021 1551   AST 12 12/17/2021 0000   ALT 8 12/17/2021 0000   ALKPHOS 44 01/05/2021 1551   BILITOT 0.4 12/17/2021 0000   BILITOT <0.2 01/05/2021 1551    GFRNONAA 99 01/05/2021 1551   GFRNONAA >60 11/08/2020 2142   GFRAA 114 01/05/2021 1551       ASSESSMENT AND PLAN  Multiple sclerosis (HCC) - Plan: MR BRAIN W WO CONTRAST, MR CERVICAL SPINE W WO CONTRAST, IgG, IgA, IgM, CBC with Differential/Platelet, CD19 and CD20, Flow Cytometry  High risk medication use - Plan: IgG, IgA, IgM, CBC with Differential/Platelet, CD19 and CD20, Flow Cytometry  Numbness - Plan: MR BRAIN W WO CONTRAST, MR CERVICAL SPINE W WO CONTRAST  Gait disturbance  Dysesthesia  1.  Continue Ocrevus.  We will check some blood work today.  MRI of the brain and cervical spine to determine if there is subclinical progression.  If this is occurring we may need to consider a different disease modifying therapy. 2. Continue lamotrigine, 50 mg twice daily.  This dose can be increased if dysesthesias worsen 3.  She will return to see me in 6 months or sooner if there are new or worsening neurologic symptoms.    Barbara Barnett A. Felecia Shelling, MD, [facility] 0000000, [id number] PM Certified in Neurology, Clinical Neurophysiology, Sleep Medicine and Neuroimaging  [facility] [address], [place], [place] [address] [phone]

## 2021-12-23 ENCOUNTER — Telehealth: Payer: Self-pay | Admitting: Neurology

## 2021-12-23 NOTE — Telephone Encounter (Signed)
Barbara Barnett Berkley Harvey: [id number] (exp. 12/23/21 to 06/21/22) & [id number] (exp. 12/23/21 to 06/21/22)   Order sent to GI, they will reach out to the patient to schedule.

## 2021-12-24 LAB — CBC WITH DIFFERENTIAL/PLATELET
Basophils Absolute: 0 10*3/uL (ref 0.0–0.2)
Basos: 0 %
EOS (ABSOLUTE): 0.1 10*3/uL (ref 0.0–0.4)
Eos: 1 %
Hematocrit: 35.9 % (ref 34.0–46.6)
Hemoglobin: 12.3 g/dL (ref 11.1–15.9)
Immature Grans (Abs): 0 10*3/uL (ref 0.0–0.1)
Immature Granulocytes: 0 %
Lymphocytes Absolute: 1.4 10*3/uL (ref 0.7–3.1)
Lymphs: 31 %
MCH: 29.4 pg (ref 26.6–33.0)
MCHC: 34.3 g/dL (ref 31.5–35.7)
MCV: 86 fL (ref 79–97)
Monocytes Absolute: 0.6 10*3/uL (ref 0.1–0.9)
Monocytes: 14 %
Neutrophils Absolute: 2.4 10*3/uL (ref 1.4–7.0)
Neutrophils: 54 %
Platelets: 306 10*3/uL (ref 150–450)
RBC: 4.19 x10E6/uL (ref 3.77–5.28)
RDW: 14.5 % (ref 11.7–15.4)
WBC: 4.5 10*3/uL (ref 3.4–10.8)

## 2021-12-24 LAB — IGG, IGA, IGM
IgA/Immunoglobulin A, Serum: 185 mg/dL (ref 87–352)
IgG (Immunoglobin G), Serum: 574 mg/dL — ABNORMAL LOW (ref 586–1602)
IgM (Immunoglobulin M), Srm: 167 mg/dL (ref 26–217)

## 2021-12-24 LAB — CD19 AND CD20, FLOW CYTOMETRY

## 2021-12-25 MED ORDER — VALACYCLOVIR HCL 500 MG PO TABS: 500.0000 mg | ORAL_TABLET | Freq: Every day | ORAL | 3 refills | Status: DC

## 2022-01-06 ENCOUNTER — Ambulatory Visit
Admission: RE | Admit: 2022-01-06 | Discharge: 2022-01-06 | Disposition: A | Discharge status: Home / Self Care | Payer: No Typology Code available for payment source | Source: Ambulatory Visit | Attending: Neurology | Admitting: Neurology

## 2022-01-06 ENCOUNTER — Other Ambulatory Visit: Payer: Self-pay

## 2022-01-06 DIAGNOSIS — G35 Multiple sclerosis: Secondary | ICD-10-CM | POA: Diagnosis not present

## 2022-01-06 DIAGNOSIS — R200 Anesthesia of skin: Secondary | ICD-10-CM

## 2022-01-06 MED ORDER — GADOBENATE DIMEGLUMINE 529 MG/ML IV SOLN
12.0000 mL | Freq: Once | INTRAVENOUS | Status: AC | PRN
  Administered 2022-01-06: 12 mL via INTRAVENOUS

## 2022-01-13 ENCOUNTER — Emergency Department (HOSPITAL_COMMUNITY)
Admission: EM | Admit: 2022-01-13 | Discharge: 2022-01-14 | Disposition: A | Discharge status: Home / Self Care | Payer: No Typology Code available for payment source | Attending: Physician Assistant | Admitting: Physician Assistant

## 2022-01-13 ENCOUNTER — Other Ambulatory Visit: Payer: Self-pay

## 2022-01-13 ENCOUNTER — Emergency Department (HOSPITAL_COMMUNITY): Payer: No Typology Code available for payment source

## 2022-01-13 ENCOUNTER — Encounter (HOSPITAL_COMMUNITY): Payer: Self-pay

## 2022-01-13 DIAGNOSIS — Y92410 Unspecified street and highway as the place of occurrence of the external cause: Secondary | ICD-10-CM | POA: Insufficient documentation

## 2022-01-13 DIAGNOSIS — M25512 Pain in left shoulder: Secondary | ICD-10-CM | POA: Diagnosis not present

## 2022-01-13 DIAGNOSIS — R519 Headache, unspecified: Secondary | ICD-10-CM | POA: Diagnosis not present

## 2022-01-13 DIAGNOSIS — M549 Dorsalgia, unspecified: Secondary | ICD-10-CM | POA: Diagnosis not present

## 2022-01-13 DIAGNOSIS — M542 Cervicalgia: Secondary | ICD-10-CM | POA: Insufficient documentation

## 2022-01-13 DIAGNOSIS — Z5321 Procedure and treatment not carried out due to patient leaving prior to being seen by health care provider: Secondary | ICD-10-CM | POA: Insufficient documentation
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
## 2022-01-14 ENCOUNTER — Ambulatory Visit: Payer: No Typology Code available for payment source

## 2022-01-14 NOTE — ED Notes (Signed)
Called by NT without response

## 2022-01-15 ENCOUNTER — Telehealth: Payer: Self-pay | Admitting: General Practice

## 2022-01-15 NOTE — Telephone Encounter (Signed)
Transition Care Management Unsuccessful Follow-up Telephone Call  Date of discharge and from where:  01/14/22 from [facility]  Attempts:  1st Attempt  Reason for unsuccessful TCM follow-up call:  Left voice message Please schedule the patient a 2-3 day hospital follow up when she calls back.

## 2022-01-19 ENCOUNTER — Ambulatory Visit: Payer: No Typology Code available for payment source | Admitting: Neurology

## 2022-01-20 NOTE — Telephone Encounter (Signed)
Transition Care Management Unsuccessful Follow-up Telephone Call  Date of discharge and from where:  01/14/22 from [facility]  Attempts:  2nd Attempt  Reason for unsuccessful TCM follow-up call:  Left voice message

## 2022-01-22 NOTE — Telephone Encounter (Signed)
Transition Care Management Unsuccessful Follow-up Telephone Call  Date of discharge and from where:  01/14/22 from [facility]  Attempts:  3rd Attempt  Reason for unsuccessful TCM follow-up call:  No answer/busy

## 2022-01-31 ENCOUNTER — Other Ambulatory Visit: Payer: Self-pay | Admitting: Neurology
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
## 2022-06-24 ENCOUNTER — Encounter: Payer: Self-pay | Admitting: Neurology

## 2022-06-24 ENCOUNTER — Ambulatory Visit (INDEPENDENT_AMBULATORY_CARE_PROVIDER_SITE_OTHER): Payer: No Typology Code available for payment source | Admitting: Neurology

## 2022-06-24 VITALS — BP 127/79 | HR 86 | Ht 60.0 in | Wt 140.0 lb

## 2022-06-24 DIAGNOSIS — R208 Other disturbances of skin sensation: Secondary | ICD-10-CM

## 2022-06-24 DIAGNOSIS — R269 Unspecified abnormalities of gait and mobility: Secondary | ICD-10-CM | POA: Diagnosis not present

## 2022-06-24 DIAGNOSIS — G35 Multiple sclerosis: Secondary | ICD-10-CM

## 2022-06-24 DIAGNOSIS — Z79899 Other long term (current) drug therapy: Secondary | ICD-10-CM | POA: Diagnosis not present

## 2022-06-24 NOTE — Progress Notes (Signed)
Faxed completed/signed FMLA form to [employer] at [phone]. Received fax confirmation. Dr. Epimenio Foot providing reduced schedule: 6 days a yr for IV treatments or doctor visits. Also, intermittent leave: 1 week/quarter for unscheduled visits.

## 2022-06-24 NOTE — Progress Notes (Signed)
[facility]  PATIENT: Barbara Barnett DOB: [birth year]  REFERRING DOCTOR OR PCP:  Merwyn Katos, DPM SOURCE: Patient, notes from podiatry, notes from emergency room visit.  Laboratory and imaging results reviewed.  CT images personally reviewed.  _________________________________   HISTORICAL  CHIEF COMPLAINT:  Chief Complaint  Patient presents with   Follow-up    Rm 1, alone. Here for 6 month MS f/u, on Ocrevus and tolerating well. Last infusion date: 03/04/2022 and Next infusion date: 09/02/2022 (TBD). Pt has started a new position and is unsure she can keep her upcoming infusion. Pt wondering how late can she hold off her infusion. MS stable, no new sx.      HISTORY OF PRESENT ILLNESS:  Barbara Barnett is a 31 y.o. woman with relapsing remitting MS  Update 06/24/2022 She had her last Ocrevus infusion on 03/04/22 and next one should be 09/02/2022 but she will be in training.  She tolerated it well.    We discussed moving next infusion but no more than 8 months (so before 11/02/2022)  The dysesthesias in the hands are doing better since going on lamotrigine.. The more severe numbness noted in September 2021 has resolved.   She notes no weakness in her legs.  Balance is slightly off and for safey she holds the bannister on stairs.   She can walk > 1 mile but not as far as before MS.   She has not noted any change in her bladder.    Vision is ok with newer glasses.     MRIs of the brain and cervical spine 01/01/2021 were consistent with MS.  Specifically, she had supratentorial and infratentorial lesions, some enhancing and  6 foci in spinal cord -- the one at T7-T8 enhancing.    Data: Imaging: MRI of the brain 01/01/2021 shows multiple T2/FLAIR hyperintense foci in the hemispheres and brainstem consistent with MS.  MAny of the foci in the hemispheres and one at the pontomedullary junction enhanced after contrast.  Some of the enhancing foci in the hemispheres had  mass-effect.  MRI of the cervical spine 01/01/2021 showed T2 hyperintense foci at C5-C6 and T1-T2.  They do not enhance.  MRI of the thoracic spine 01/05/2021 showed hyperintense foci within the spinal cord adjacent to T1-T2, T3, T6-T7 and T7-T8.  There is subtle enhancement of the focus adjacent to T7-T8..    Laboratory: CBC 11/08/2020 was normal.  BMP 11/08/2020 shows low potassium (2.8)  REVIEW OF SYSTEMS: Constitutional: No fevers, chills, sweats, or change in appetite Eyes: No visual changes, double vision, eye pain Ear, nose and throat: No hearing loss, ear pain, nasal congestion, sore throat Cardiovascular: No chest pain, palpitations Respiratory:  No shortness of breath at rest or with exertion.   No wheezes GastrointestinaI: No nausea, vomiting, diarrhea, abdominal pain, fecal incontinence Genitourinary:  No dysuria, urinary retention or frequency.  No nocturia. Musculoskeletal:  No neck pain, back pain Integumentary: No rash, pruritus, skin lesions Neurological: as above Psychiatric: No depression at this time.  No anxiety Endocrine: No palpitations, diaphoresis, change in appetite, change in weigh or increased thirst Hematologic/Lymphatic:  No anemia, purpura, petechiae. Allergic/Immunologic: No itchy/runny eyes, nasal congestion, recent allergic reactions, rashes  ALLERGIES: No Known Allergies  HOME MEDICATIONS:  Current Outpatient Medications:    etonogestrel-ethinyl estradiol (NUVARING) 0.12-0.015 MG/24HR vaginal ring, Place 1 each vaginally every 21 ( twenty-one) days., Disp: , Rfl:    hydrOXYzine (ATARAX/VISTARIL) 50 MG tablet, Take 0.5-1 tablets (25-50 mg total) by mouth at bedtime  as needed., Disp: 90 tablet, Rfl: 3   lamoTRIgine (LAMICTAL) 25 MG tablet, Take 2 tablets (50 mg total) by mouth 2 (two) times daily., Disp: 360 tablet, Rfl: 3   meloxicam (MOBIC) 15 MG tablet, ONE TAB BY MOUTH EVERY MORNING WITH A MEAL FOR 2 WEEKS, THEN DAILY AS NEEDED FOR PAIN., Disp: 30  tablet, Rfl: 3   valACYclovir (VALTREX) 500 MG tablet, Take 1 tablet (500 mg total) by mouth daily., Disp: 90 tablet, Rfl: 3  PAST MEDICAL HISTORY: Past Medical History:  Diagnosis Date   Anxiety 49   Comes and goes on bad days i have anxiety attacks   Depression 2015   It comes and goes   Multiple sclerosis ([place])     PAST SURGICAL HISTORY: Past Surgical History:  Procedure Laterality Date   APPENDECTOMY     LAPAROSCOPIC APPENDECTOMY N/A 04/28/2020   Procedure: APPENDECTOMY LAPAROSCOPIC;  Surgeon: Donnie Mesa, MD;  Location: [facility];  Service: General;  Laterality: N/A;    FAMILY HISTORY: Family History  Problem Relation Age of Onset   Diabetes type II Father    Stroke Father    Hypertension Father    Depression Father    Diabetes Father    Diabetes Paternal Uncle    Hypertension Paternal Uncle     SOCIAL HISTORY:  Social History   Socioeconomic History   Marital status: Single    Spouse name: Not on file   Number of children: 1   Years of education: BA   Highest education level: Not on file  Occupational History   Occupation: Airline pilot  Tobacco Use   Smoking status: Former    Types: E-cigarettes   Smokeless tobacco: Never   Tobacco comments:    1 cigar/socially (usually when she drinks)  Vaping Use   Vaping Use: Every day  Substance and Sexual Activity   Alcohol use: Yes    Alcohol/week: 6.0 standard drinks of alcohol    Types: 6 Shots of liquor per week    Comment: 3 drinks per weekend   Drug use: Never   Sexual activity: Yes    Birth control/protection: Condom, Inserts  Other Topics Concern   Not on file  Social History Narrative   Right handed    Lives alone with her son (6 y/o)-12/18/20   Caffeine use: none   Social Determinants of Radio broadcast assistant Strain: Not on file  Food Insecurity: Not on file  Transportation Needs: Not on file  Physical Activity: Not on file  Stress: Not on file  Social Connections: Not on file  Intimate  Partner Violence: Not on file     PHYSICAL EXAM  Vitals:   06/24/22 0812  BP: 127/79  Pulse: 86  Weight: 140 lb (63.5 kg)  Height: 5' (1.524 m)    Body mass index is 27.34 kg/m.   General: The patient is well-developed and well-nourished and in no acute distress  HEENT:  Head is [place]/AT.  Sclera are anicteric.    Neck:   The neck is nontender.  Skin: Extremities are without rash or  edema.  Neurologic Exam  Mental status: The patient is alert and oriented x 3 at the time of the examination. The patient has apparent normal recent and remote memory, with an apparently normal attention span and concentration ability.   Speech is normal.  Cranial nerves: Extraocular movements are full. Normal facial strength and sensation  . No obvious hearing deficits are noted.  Motor:  Muscle bulk is  normal.   Tone is normal. Strength is  5 / 5 in all 4 extremities.   Sensory: Sensory testing shows normal vibration and touch sensation in arms and legs  Coordination: Cerebellar testing reveals good finger-nose-finger and slightly reduced left heel to shin    Gait and station: Station is normal.   She has a normal gait tandem gait was mildly wide.  Romberg is negative.  Reflexes: Deep tendon reflexes are symmetric and normal in arms and legs.  No ankle clonus .       DIAGNOSTIC DATA (LABS, IMAGING, TESTING) - I reviewed patient records, labs, notes, testing and imaging myself where available.  Lab Results  Component Value Date   WBC 4.5 12/21/2021   HGB 12.3 12/21/2021   HCT 35.9 12/21/2021   MCV 86 12/21/2021   PLT 306 12/21/2021      Component Value Date/Time   NA 140 12/17/2021 0000   NA 143 01/05/2021 1551   K 4.0 12/17/2021 0000   CL 109 12/17/2021 0000   CO2 24 12/17/2021 0000   GLUCOSE 93 12/17/2021 0000   BUN 7 12/17/2021 0000   BUN 12 01/05/2021 1551   CREATININE 0.76 12/17/2021 0000   CALCIUM 9.0 12/17/2021 0000   PROT 6.6 12/17/2021 0000   PROT 6.6 01/05/2021  1551   ALBUMIN 4.3 01/05/2021 1551   AST 12 12/17/2021 0000   ALT 8 12/17/2021 0000   ALKPHOS 44 01/05/2021 1551   BILITOT 0.4 12/17/2021 0000   BILITOT <0.2 01/05/2021 1551   GFRNONAA 99 01/05/2021 1551   GFRNONAA >60 11/08/2020 2142   GFRAA 114 01/05/2021 1551       ASSESSMENT AND PLAN  Multiple sclerosis (HCC) - Plan: IgG, IgA, IgM, CBC with Differential/Platelet  High risk medication use - Plan: IgG, IgA, IgM, CBC with Differential/Platelet  Gait disturbance  Dysesthesia  1.  Continue Ocrevus.  We will check some blood work today.  We discussed that her Ocrevus could be moved back by up to 2 months but I would not want her to delay the infusion more than that. 2. Continue lamotrigine for dysesthesias 3.  She will return to see me in 6 months or sooner if there are new or worsening neurologic symptoms.    Rhoda Waldvogel A. Epimenio Foot, MD, [facility] 06/24/2022, 5:10 PM Certified in Neurology, Clinical Neurophysiology, Sleep Medicine and Neuroimaging  [facility] [address] [place], [place] [address] [phone]

## 2022-06-25 ENCOUNTER — Other Ambulatory Visit: Payer: Self-pay | Admitting: Neurology

## 2022-06-25 LAB — CBC WITH DIFFERENTIAL/PLATELET
Basophils Absolute: 0 10*3/uL (ref 0.0–0.2)
Basos: 0 %
EOS (ABSOLUTE): 0.1 10*3/uL (ref 0.0–0.4)
Eos: 1 %
Hematocrit: 36.9 % (ref 34.0–46.6)
Hemoglobin: 12.1 g/dL (ref 11.1–15.9)
Immature Grans (Abs): 0 10*3/uL (ref 0.0–0.1)
Immature Granulocytes: 0 %
Lymphocytes Absolute: 1.3 10*3/uL (ref 0.7–3.1)
Lymphs: 20 %
MCH: 29.3 pg (ref 26.6–33.0)
MCHC: 32.8 g/dL (ref 31.5–35.7)
MCV: 89 fL (ref 79–97)
Monocytes Absolute: 1 10*3/uL — ABNORMAL HIGH (ref 0.1–0.9)
Monocytes: 16 %
Neutrophils Absolute: 4.2 10*3/uL (ref 1.4–7.0)
Neutrophils: 63 %
Platelets: 285 10*3/uL (ref 150–450)
RBC: 4.13 x10E6/uL (ref 3.77–5.28)
RDW: 14.9 % (ref 11.7–15.4)
WBC: 6.6 10*3/uL (ref 3.4–10.8)

## 2022-06-25 LAB — IGG, IGA, IGM
IgA/Immunoglobulin A, Serum: 196 mg/dL (ref 87–352)
IgG (Immunoglobin G), Serum: 532 mg/dL — ABNORMAL LOW (ref 586–1602)
IgM (Immunoglobulin M), Srm: 170 mg/dL (ref 26–217)

## 2022-06-25 MED ORDER — OCREVUS 300 MG/10ML IV SOLN
INTRAVENOUS | 4 refills | Status: DC
Start: 2021-02-16 — End: 2023-03-21

## 2022-07-17 ENCOUNTER — Other Ambulatory Visit: Payer: Self-pay | Admitting: Sports Medicine

## 2022-07-17 DIAGNOSIS — M7542 Impingement syndrome of left shoulder: Secondary | ICD-10-CM

## 2022-10-05 ENCOUNTER — Encounter: Payer: Self-pay | Admitting: Neurology

## 2022-10-25 LAB — OB RESULTS CONSOLE HEPATITIS B SURFACE ANTIGEN: Hepatitis B Surface Ag: NEGATIVE

## 2022-10-25 LAB — OB RESULTS CONSOLE RUBELLA ANTIBODY, IGM: Rubella: IMMUNE

## 2022-10-25 LAB — OB RESULTS CONSOLE ABO/RH: RH Type: POSITIVE

## 2022-10-25 LAB — OB RESULTS CONSOLE HIV ANTIBODY (ROUTINE TESTING): HIV: NONREACTIVE

## 2022-10-25 LAB — HEPATITIS C ANTIBODY: HCV Ab: NEGATIVE

## 2022-10-25 LAB — OB RESULTS CONSOLE RPR: RPR: NONREACTIVE

## 2022-10-26 ENCOUNTER — Other Ambulatory Visit: Payer: Self-pay | Admitting: Medical-Surgical

## 2022-11-29 NOTE — L&D Delivery Note (Signed)
Delivery Note At 5:05 PM Barbara viable and healthy female was delivered via Vaginal, Spontaneous (Presentation: Left Occiput Anterior).  APGAR: 8, 9; weight pending .   Placenta status: Spontaneous, Intact. Not sent Cord: loop around neck and body  3 vessels with the following complications: None.  Cord pH: n/Barbara  Anesthesia: Epidural Episiotomy: None Lacerations: None Suture Repair:  n/Barbara Est. Blood Loss (mL): 101  Mom to postpartum.  Baby to Couplet care / Skin to Skin.  Barbara Barnett Barbara Barnett 05/18/2023, 5:36 PM

## 2022-12-20 ENCOUNTER — Encounter: Payer: No Typology Code available for payment source | Admitting: Medical-Surgical

## 2022-12-29 NOTE — Progress Notes (Deleted)
[facility]  PATIENT: Barbara Barnett DOB: 1990-12-09  REFERRING DOCTOR OR PCP:  Inocencio Homes, DPM SOURCE: Patient, notes from podiatry, notes from emergency room visit.  Laboratory and imaging results reviewed.  CT images personally reviewed.  _________________________________   HISTORICAL  CHIEF COMPLAINT:  No chief complaint on file.   HISTORY OF PRESENT ILLNESS:  Barbara Barnett is a 32 y.o. woman with relapsing remitting MS  Update 12/30/2022 Returns for follow-up visit.  Prior visit 06/24/2022 with Dr. Felecia Shelling.  Last Ocrevus infusion ***. She is currently *** pregnant. She had her last Ocrevus infusion on 03/04/22 and next one should be 09/02/2022 but she will be in training.  She tolerated it well.    We discussed moving next infusion but no more than 8 months (so before 11/02/2022)  The dysesthesias in the hands are doing better since going on lamotrigine.. The more severe numbness noted in September 2021 has resolved.   She notes no weakness in her legs.  Balance is slightly off and for safey she holds the bannister on stairs.   She can walk > 1 mile but not as far as before MS.   She has not noted any change in her bladder.    Vision is ok with newer glasses.     MRIs of the brain and cervical spine 01/01/2021 were consistent with MS.  Specifically, she had supratentorial and infratentorial lesions, some enhancing and  6 foci in spinal cord -- the one at T7-T8 enhancing.    Data: Imaging: MRI of the brain 01/01/2021 shows multiple T2/FLAIR hyperintense foci in the hemispheres and brainstem consistent with MS.  MAny of the foci in the hemispheres and one at the pontomedullary junction enhanced after contrast.  Some of the enhancing foci in the hemispheres had mass-effect.  MRI of the cervical spine 01/01/2021 showed T2 hyperintense foci at C5-C6 and T1-T2.  They do not enhance.  MRI of the thoracic spine 01/05/2021 showed hyperintense foci within the spinal cord  adjacent to T1-T2, T3, T6-T7 and T7-T8.  There is subtle enhancement of the focus adjacent to T7-T8..    Laboratory: CBC 11/08/2020 was normal.  BMP 11/08/2020 shows low potassium (2.8)  REVIEW OF SYSTEMS: Constitutional: No fevers, chills, sweats, or change in appetite Eyes: No visual changes, double vision, eye pain Ear, nose and throat: No hearing loss, ear pain, nasal congestion, sore throat Cardiovascular: No chest pain, palpitations Respiratory:  No shortness of breath at rest or with exertion.   No wheezes GastrointestinaI: No nausea, vomiting, diarrhea, abdominal pain, fecal incontinence Genitourinary:  No dysuria, urinary retention or frequency.  No nocturia. Musculoskeletal:  No neck pain, back pain Integumentary: No rash, pruritus, skin lesions Neurological: as above Psychiatric: No depression at this time.  No anxiety Endocrine: No palpitations, diaphoresis, change in appetite, change in weigh or increased thirst Hematologic/Lymphatic:  No anemia, purpura, petechiae. Allergic/Immunologic: No itchy/runny eyes, nasal congestion, recent allergic reactions, rashes  ALLERGIES: No Known Allergies  HOME MEDICATIONS:  Current Outpatient Medications:    etonogestrel-ethinyl estradiol (NUVARING) 0.12-0.015 MG/24HR vaginal ring, Place 1 each vaginally every 21 ( twenty-one) days., Disp: , Rfl:    hydrOXYzine (ATARAX) 50 MG tablet, TAKE 0.5-1 TABLETS (25-50 MG TOTAL) BY MOUTH AT BEDTIME AS NEEDED., Disp: 90 tablet, Rfl: 0   lamoTRIgine (LAMICTAL) 25 MG tablet, Take 2 tablets (50 mg total) by mouth 2 (two) times daily., Disp: 360 tablet, Rfl: 3   meloxicam (MOBIC) 15 MG tablet, ONE TAB BY MOUTH EVERY  MORNING WITH A MEAL FOR 2 WEEKS, THEN DAILY AS NEEDED FOR PAIN., Disp: 30 tablet, Rfl: 3   ocrelizumab (OCREVUS) 300 MG/10ML injection, 600 mg q 6 months, Disp: 20 mL, Rfl: 4   valACYclovir (VALTREX) 500 MG tablet, Take 1 tablet (500 mg total) by mouth daily., Disp: 90 tablet, Rfl:  3  PAST MEDICAL HISTORY: Past Medical History:  Diagnosis Date   Anxiety 57   Comes and goes on bad days i have anxiety attacks   Depression 2015   It comes and goes   Multiple sclerosis ([place])     PAST SURGICAL HISTORY: Past Surgical History:  Procedure Laterality Date   APPENDECTOMY     LAPAROSCOPIC APPENDECTOMY N/A 04/28/2020   Procedure: APPENDECTOMY LAPAROSCOPIC;  Surgeon: Donnie Mesa, MD;  Location: [place];  Service: General;  Laterality: N/A;    FAMILY HISTORY: Family History  Problem Relation Age of Onset   Diabetes type II Father    Stroke Father    Hypertension Father    Depression Father    Diabetes Father    Diabetes Paternal Uncle    Hypertension Paternal Uncle     SOCIAL HISTORY:  Social History   Socioeconomic History   Marital status: Single    Spouse name: Not on file   Number of children: 1   Years of education: BA   Highest education level: Not on file  Occupational History   Occupation: Airline pilot  Tobacco Use   Smoking status: Former    Types: E-cigarettes   Smokeless tobacco: Never   Tobacco comments:    1 cigar/socially (usually when she drinks)  Vaping Use   Vaping Use: Every day  Substance and Sexual Activity   Alcohol use: Yes    Alcohol/week: 6.0 standard drinks of alcohol    Types: 6 Shots of liquor per week    Comment: 3 drinks per weekend   Drug use: Never   Sexual activity: Yes    Birth control/protection: Condom, Inserts  Other Topics Concern   Not on file  Social History Narrative   Right handed    Lives alone with her son (6 y/o)-12/18/20   Caffeine use: none   Social Determinants of Radio broadcast assistant Strain: Not on file  Food Insecurity: Not on file  Transportation Needs: Not on file  Physical Activity: Not on file  Stress: Not on file  Social Connections: Not on file  Intimate Partner Violence: Not on file     PHYSICAL EXAM  There were no vitals filed for this visit.   There is no height or  weight on file to calculate BMI.   General: The patient is well-developed and well-nourished and in no acute distress  HEENT:  Head is [place]/AT.  Sclera are anicteric.    Neck:   The neck is nontender.  Skin: Extremities are without rash or  edema.  Neurologic Exam  Mental status: The patient is alert and oriented x 3 at the time of the examination. The patient has apparent normal recent and remote memory, with an apparently normal attention span and concentration ability.   Speech is normal.  Cranial nerves: Extraocular movements are full. Normal facial strength and sensation  . No obvious hearing deficits are noted.  Motor:  Muscle bulk is normal.   Tone is normal. Strength is  5 / 5 in all 4 extremities.   Sensory: Sensory testing shows normal vibration and touch sensation in arms and legs  Coordination: Cerebellar testing reveals  good finger-nose-finger and slightly reduced left heel to shin    Gait and station: Station is normal.   She has a normal gait tandem gait was mildly wide.  Romberg is negative.  Reflexes: Deep tendon reflexes are symmetric and normal in arms and legs.  No ankle clonus .       DIAGNOSTIC DATA (LABS, IMAGING, TESTING) - I reviewed patient records, labs, notes, testing and imaging myself where available.  Lab Results  Component Value Date   WBC 6.6 06/24/2022   HGB 12.1 06/24/2022   HCT 36.9 06/24/2022   MCV 89 06/24/2022   PLT 285 06/24/2022      Component Value Date/Time   NA 140 12/17/2021 0000   NA 143 01/05/2021 1551   K 4.0 12/17/2021 0000   CL 109 12/17/2021 0000   CO2 24 12/17/2021 0000   GLUCOSE 93 12/17/2021 0000   BUN 7 12/17/2021 0000   BUN 12 01/05/2021 1551   CREATININE 0.76 12/17/2021 0000   CALCIUM 9.0 12/17/2021 0000   PROT 6.6 12/17/2021 0000   PROT 6.6 01/05/2021 1551   ALBUMIN 4.3 01/05/2021 1551   AST 12 12/17/2021 0000   ALT 8 12/17/2021 0000   ALKPHOS 44 01/05/2021 1551   BILITOT 0.4 12/17/2021 0000   BILITOT  <0.2 01/05/2021 1551   GFRNONAA 99 01/05/2021 1551   GFRNONAA >60 11/08/2020 2142   GFRAA 114 01/05/2021 1551       ASSESSMENT AND PLAN  No diagnosis found.  1.  Continue Ocrevus.  We will check some blood work today.  We discussed that her Ocrevus could be moved back by up to 2 months but I would not want her to delay the infusion more than that. 2. Continue lamotrigine for dysesthesias 3.  She will return to see me in 6 months or sooner if there are new or worsening neurologic symptoms.    I spent *** minutes of face-to-face and non-face-to-face time with patient.  This included previsit chart review, lab review, study review, order entry, electronic health record documentation, patient education  Frann Rider, [facility]  [facility] [address] [place], [place] [address]  Phone [phone] Fax [phone] Note: This document was prepared with digital dictation and possible smart phrase technology. Any transcriptional errors that result from this process are unintentional.

## 2022-12-30 ENCOUNTER — Ambulatory Visit: Payer: No Typology Code available for payment source | Admitting: Adult Health

## 2023-01-18 ENCOUNTER — Encounter: Payer: No Typology Code available for payment source | Admitting: Medical-Surgical

## 2023-01-24 ENCOUNTER — Ambulatory Visit (INDEPENDENT_AMBULATORY_CARE_PROVIDER_SITE_OTHER): Payer: No Typology Code available for payment source | Admitting: Medical-Surgical

## 2023-01-24 ENCOUNTER — Encounter: Payer: Self-pay | Admitting: Medical-Surgical

## 2023-01-24 VITALS — BP 106/65 | HR 116 | Resp 20 | Ht 60.0 in | Wt 150.8 lb

## 2023-01-24 DIAGNOSIS — Z0000 Encounter for general adult medical examination without abnormal findings: Secondary | ICD-10-CM

## 2023-01-24 DIAGNOSIS — F418 Other specified anxiety disorders: Secondary | ICD-10-CM

## 2023-01-24 MED ORDER — SERTRALINE HCL 25 MG PO TABS: 25.0000 mg | ORAL_TABLET | Freq: Every day | ORAL | 3 refills | Status: DC

## 2023-01-24 NOTE — Progress Notes (Signed)
Complete physical exam  Patient: Barbara Barnett   DOB: 04-Sep-1991   32 y.o. Female  MRN: [id number]  Subjective:    Chief Complaint  Patient presents with   Annual Exam    Barbara Barnett is a 32 y.o. female who presents today for a complete physical exam. She reports consuming a general diet. The patient does not participate in regular exercise at present. She generally feels fatigued, exhausted, poor endurance. She reports sleeping fairly well. She does have additional problems to discuss today.    Most recent fall risk assessment:    01/24/2023    2:10 PM  [place] in the past year? 0  Number falls in past yr: 0  Injury with Fall? 0  Risk for fall due to : No Fall Risks  Follow up Falls evaluation completed     Most recent depression screenings:    01/24/2023    2:42 PM 12/17/2021   11:38 AM  PHQ 2/9 Scores  PHQ - 2 Score 4 1  PHQ- 9 Score 15 9    Vision:Not within last year , Dental: No current dental problems and No regular dental care , and STD: The patient reports a past history of: chlamydia and gonorrhea    Patient Care Team: Samuel Bouche, NP as PCP - General (Nurse Practitioner)   Outpatient Medications Prior to Visit  Medication Sig   etonogestrel-ethinyl estradiol (NUVARING) 0.12-0.015 MG/24HR vaginal ring Place 1 each vaginally every 21 ( twenty-one) days.   hydrOXYzine (ATARAX) 50 MG tablet TAKE 0.5-1 TABLETS (25-50 MG TOTAL) BY MOUTH AT BEDTIME AS NEEDED.   lamoTRIgine (LAMICTAL) 25 MG tablet Take 2 tablets (50 mg total) by mouth 2 (two) times daily.   meloxicam (MOBIC) 15 MG tablet ONE TAB BY MOUTH EVERY MORNING WITH A MEAL FOR 2 WEEKS, THEN DAILY AS NEEDED FOR PAIN.   ocrelizumab (OCREVUS) 300 MG/10ML injection 600 mg q 6 months   valACYclovir (VALTREX) 500 MG tablet Take 1 tablet (500 mg total) by mouth daily.   No facility-administered medications prior to visit.    Review of Systems  Constitutional:  Negative for chills, fever,  malaise/fatigue and weight loss.  HENT:  Negative for congestion, ear pain, hearing loss, sinus pain and sore throat.   Eyes:  Negative for blurred vision, photophobia and pain.  Respiratory:  Positive for shortness of breath (with activity). Negative for cough and wheezing.   Cardiovascular:  Negative for chest pain, palpitations and leg swelling.  Gastrointestinal:  Negative for abdominal pain, constipation, diarrhea, heartburn, nausea and vomiting.  Genitourinary:  Negative for dysuria, frequency and urgency.  Musculoskeletal:  Negative for falls and neck pain.  Skin:  Negative for itching and rash.  Neurological:  Negative for dizziness, weakness and headaches.  Endo/Heme/Allergies:  Negative for polydipsia. Does not bruise/bleed easily.  Psychiatric/Behavioral:  Positive for depression. Negative for substance abuse and suicidal ideas. The patient is nervous/anxious. The patient does not have insomnia.      Objective:    BP 106/65 (BP Location: Left Arm, Cuff Size: Normal)   Pulse (!) 116   Resp 20   Ht 5' (1.524 m)   Wt 150 lb 12.8 oz (68.4 kg)   SpO2 100%   BMI 29.45 kg/m    Physical Exam Vitals reviewed.  Constitutional:      General: She is not in acute distress.    Appearance: Normal appearance. She is not ill-appearing.  HENT:     Head: Normocephalic and  atraumatic.     Right Ear: Tympanic membrane, ear canal and external ear normal. There is no impacted cerumen.     Left Ear: Tympanic membrane, ear canal and external ear normal. There is no impacted cerumen.     Nose: Nose normal. No congestion or rhinorrhea.     Mouth/Throat:     Mouth: Mucous membranes are moist.     Pharynx: No oropharyngeal exudate or posterior oropharyngeal erythema.  Eyes:     General: No scleral icterus.       Right eye: No discharge.        Left eye: No discharge.     Extraocular Movements: Extraocular movements intact.     Conjunctiva/sclera: Conjunctivae normal.     Pupils: Pupils are  equal, round, and reactive to light.  Neck:     Thyroid: No thyromegaly.     Vascular: No carotid bruit or JVD.     Trachea: Trachea normal.  Cardiovascular:     Rate and Rhythm: Normal rate and regular rhythm.     Pulses: Normal pulses.     Heart sounds: Normal heart sounds. No murmur heard.    No friction rub. No gallop.  Pulmonary:     Effort: Pulmonary effort is normal. No respiratory distress.     Breath sounds: Normal breath sounds. No wheezing.  Abdominal:     General: Bowel sounds are normal. There is no distension.     Palpations: Abdomen is soft.     Tenderness: There is no abdominal tenderness. There is no guarding.  Musculoskeletal:        General: Normal range of motion.     Cervical back: Normal range of motion and neck supple.  Lymphadenopathy:     Cervical: No cervical adenopathy.  Skin:    General: Skin is warm and dry.  Neurological:     Mental Status: She is alert and oriented to person, place, and time.     Cranial Nerves: No cranial nerve deficit.  Psychiatric:        Mood and Affect: Mood normal.        Behavior: Behavior normal.        Thought Content: Thought content normal.        Judgment: Judgment normal.   No results found for any visits on 01/24/23.     Assessment & Plan:    Routine Health Maintenance and Physical Exam  Immunization History  Administered Date(s) Administered   Influenza Split 08/30/2013   PFIZER(Purple Top)SARS-COV-2 Vaccination 08/29/2020, 09/26/2020   Tdap 11/30/2007    Health Maintenance  Topic Date Due   DTaP/Tdap/Td (2 - Td or Tdap) 11/29/2017   INFLUENZA VACCINE  02/27/2023 (Originally 06/29/2022)   PAP SMEAR-Modifier  06/29/2024   HIV Screening  Completed   HPV VACCINES  Aged Out   COVID-19 Vaccine  Discontinued   Hepatitis C Screening  Discontinued   Discussed health benefits of physical activity, and encouraged her to engage in regular exercise appropriate for her age and condition.  1. Annual physical  exam Deferring labs today.  These have been checked recently with OB/GYN.  Needs updating on visual and dental preventative care.  Wellness information provided with AVS.  2. Anxiety with depression Significant anxiety and depressive symptoms not currently medicated.  Starting sertraline 25 mg daily.  Discussed potential side effects and expectations for effectiveness.  Return in about 4 weeks (around 02/21/2023) for mood follow up.   Samuel Bouche, NP

## 2023-01-26 ENCOUNTER — Other Ambulatory Visit: Payer: Self-pay | Admitting: Medical-Surgical

## 2023-02-16 ENCOUNTER — Other Ambulatory Visit: Payer: Self-pay | Admitting: Medical-Surgical

## 2023-02-17 ENCOUNTER — Inpatient Hospital Stay (HOSPITAL_COMMUNITY)
Admission: AD | Admit: 2023-02-17 | Discharge: 2023-02-17 | Disposition: A | Discharge status: Home / Self Care | Payer: No Typology Code available for payment source | Attending: Obstetrics and Gynecology | Admitting: Obstetrics and Gynecology

## 2023-02-17 ENCOUNTER — Encounter (HOSPITAL_COMMUNITY): Payer: Self-pay

## 2023-02-17 DIAGNOSIS — Z3A28 28 weeks gestation of pregnancy: Secondary | ICD-10-CM | POA: Insufficient documentation

## 2023-02-17 DIAGNOSIS — R109 Unspecified abdominal pain: Secondary | ICD-10-CM | POA: Diagnosis not present

## 2023-02-17 DIAGNOSIS — K5909 Other constipation: Secondary | ICD-10-CM

## 2023-02-17 DIAGNOSIS — O26893 Other specified pregnancy related conditions, third trimester: Secondary | ICD-10-CM | POA: Insufficient documentation

## 2023-02-17 HISTORY — DX: Herpesviral infection, unspecified: B00.9

## 2023-02-17 LAB — URINALYSIS, ROUTINE W REFLEX MICROSCOPIC
Bilirubin Urine: NEGATIVE
Glucose, UA: NEGATIVE mg/dL
Hgb urine dipstick: NEGATIVE
Ketones, ur: NEGATIVE mg/dL
Nitrite: NEGATIVE
Protein, ur: NEGATIVE mg/dL
Specific Gravity, Urine: 1.01 (ref 1.005–1.030)
pH: 5 (ref 5.0–8.0)

## 2023-02-17 NOTE — MAU Note (Signed)
Pt reports to MAU with ctx like pain mainly in lower left abd and hip. Pt states it started Sunday and has come and gone since then with no real relief. Reports moderate discharge but denies VB, reports +FM. Per pt, no problems in the pregnancy. Dr. Garwin Brothers notified and records secure fax.

## 2023-02-17 NOTE — MAU Provider Note (Signed)
History     Chief Complaint  Patient presents with   Abdominal Pain   Contractions   32 yo G2P1 BF @ 26 5/[redacted] wk gestation presents with c/o abdominal pain in LLQ and hip that started Sunday. Pain has resolved but came in to be checked. Denies vaginal bleeding (+) FM . No vaginal discharge. Has had problem with BM  OB History     Gravida  2   Para  1   Term  1   Preterm      AB      Living  1      SAB      IAB      Ectopic      Multiple      Live Births  1           Past Medical History:  Diagnosis Date   Anxiety 15   Comes and goes on bad days i have anxiety attacks   Depression 2015   It comes and goes   HSV-2 infection    Multiple sclerosis ([place])     Past Surgical History:  Procedure Laterality Date   APPENDECTOMY     DILATION AND CURETTAGE OF UTERUS     LAPAROSCOPIC APPENDECTOMY N/A 04/28/2020   Procedure: APPENDECTOMY LAPAROSCOPIC;  Surgeon: Donnie Mesa, MD;  Location: [facility];  Service: General;  Laterality: N/A;    Family History  Problem Relation Age of Onset   Diabetes type II Father    Stroke Father    Hypertension Father    Depression Father    Diabetes Father    Diabetes Paternal Uncle    Hypertension Paternal Uncle     Social History   Tobacco Use   Smoking status: Former    Types: E-cigarettes   Smokeless tobacco: Never   Tobacco comments:    1 cigar/socially (usually when she drinks)  Vaping Use   Vaping Use: Every day  Substance Use Topics   Alcohol use: Not Currently    Alcohol/week: 6.0 standard drinks of alcohol    Types: 6 Shots of liquor per week    Comment: 3 drinks per weekend   Drug use: Never    Allergies: No Known Allergies  Medications Prior to Admission  Medication Sig Dispense Refill Last Dose   valACYclovir (VALTREX) 500 MG tablet Take 1 tablet (500 mg total) by mouth daily. 90 tablet 3 02/16/2023   etonogestrel-ethinyl estradiol (NUVARING) 0.12-0.015 MG/24HR vaginal ring Place 1 each vaginally  every 21 ( twenty-one) days.      hydrOXYzine (ATARAX) 50 MG tablet TAKE 0.5-1 TABLETS (25-50 MG TOTAL) BY MOUTH AT BEDTIME AS NEEDED. 90 tablet 0    lamoTRIgine (LAMICTAL) 25 MG tablet Take 2 tablets (50 mg total) by mouth 2 (two) times daily. 360 tablet 3    meloxicam (MOBIC) 15 MG tablet ONE TAB BY MOUTH EVERY MORNING WITH A MEAL FOR 2 WEEKS, THEN DAILY AS NEEDED FOR PAIN. 30 tablet 3    ocrelizumab (OCREVUS) 300 MG/10ML injection 600 mg q 6 months 20 mL 4    sertraline (ZOLOFT) 25 MG tablet TAKE 1 TABLET (25 MG TOTAL) BY MOUTH DAILY. 90 tablet 0 More than a month     Physical Exam   Blood pressure 117/69, pulse (!) 102, temperature 98.4 F (36.9 C), temperature source Oral, last menstrual period 08/14/2022, SpO2 99 %.  General appearance: alert, cooperative, and no distress Lungs: clear to auscultation bilaterally Heart: regular rate and rhythm, S1, S2 normal,  no murmur, click, rub or gallop Abdomen:  gravid soft non tender Pelvic: cervix normal in appearance, external genitalia normal, vagina normal without discharge, and vault( rectal ) full of stool Extremities: extremities normal, atraumatic, no cyanosis or edema  Tracing : baseline 150 NR  No accels No ctx ED Course  IMP;  abdominal pain in pregnancy constipation IUP @  26 + wk  P) smooth move tea, miralax. Glycerin suppository. Keep OB appt MDM   Marvene Staff, MD 8:46 AM 02/17/2023

## 2023-02-17 NOTE — Discharge Instructions (Signed)
Miralax daily or every other day. Smooth move tea today. Increase fiber intake and water

## 2023-02-21 ENCOUNTER — Encounter: Payer: Self-pay | Admitting: Medical-Surgical

## 2023-02-21 ENCOUNTER — Ambulatory Visit (INDEPENDENT_AMBULATORY_CARE_PROVIDER_SITE_OTHER): Payer: No Typology Code available for payment source | Admitting: Medical-Surgical

## 2023-02-21 VITALS — BP 99/63 | HR 108 | Resp 20 | Ht 60.0 in | Wt 156.1 lb

## 2023-02-21 DIAGNOSIS — F418 Other specified anxiety disorders: Secondary | ICD-10-CM | POA: Diagnosis not present

## 2023-02-21 MED ORDER — SERTRALINE HCL 50 MG PO TABS: 50.0000 mg | ORAL_TABLET | Freq: Every day | ORAL | 1 refills | Status: DC

## 2023-02-21 NOTE — Progress Notes (Signed)
Established Patient Office Visit  Subjective   Patient ID: Barbara Barnett, female   DOB: 08/08/1991 Age: 32 y.o. MRN: [id number]   Chief Complaint  Patient presents with   Follow-up    MOOD   HPI Pleasant 32 year old female presenting to follow-up on anxiety and depression.  Notes that she has been taking sertraline 25 mg daily, tolerating well without side effects.  We tried to initiate a small dose of Lamictal at 25 mg daily to help combat mood swings however she was unable to tolerate this.  She took about 3 doses of the medication and each time experienced palpitations.  She stopped the medication and is only been on the Zoloft since then.  Notes her symptoms are pretty much the same as they were 4 weeks ago and is interested in other options to help manage.  Denies SI/HI.     01/24/2023    2:42 PM 12/17/2021   11:38 AM 10/20/2021   10:51 AM 06/18/2020    1:35 PM 05/26/2020    8:33 AM  Depression screen PHQ 2/9  Decreased Interest 3  3 3 3   Down, Depressed, Hopeless 1 1 3 1  0  PHQ - 2 Score 4 1 6 4 3   Altered sleeping 2 3 3 1 3   Tired, decreased energy 3 3 3 1 3   Change in appetite 2 2 0 1 0  Feeling bad or failure about yourself  1 0 2 0 0  Trouble concentrating 3 0 2 2 1   Moving slowly or fidgety/restless 0 0 0 0 0  Suicidal thoughts 0 0 0 0 0  PHQ-9 Score 15 9 16 9 10   Difficult doing work/chores Very difficult Very difficult Somewhat difficult        01/24/2023    2:42 PM 12/17/2021   11:38 AM 10/20/2021   10:52 AM 05/26/2020    8:33 AM  GAD 7 : Generalized Anxiety Score  Nervous, Anxious, on Edge 1 1 2 2   Control/stop worrying 3 1 2 2   Worry too much - different things 3 2 3 2   Trouble relaxing 2 3 3 2   Restless 0 0 0 0  Easily annoyed or irritable 3 3 3 1   Afraid - awful might happen 2 2 3 1   Total GAD 7 Score 14 12 16 10   Anxiety Difficulty Very difficult Very difficult Very difficult     Objective:    Vitals:   02/21/23 1318  BP: 99/63  Pulse: (!) 108   Resp: 20  Height: 5' (1.524 m)  Weight: 156 lb 1.6 oz (70.8 kg)  SpO2: 100%  BMI (Calculated): 30.49   Physical Exam Vitals reviewed.  Constitutional:      General: She is not in acute distress.    Appearance: Normal appearance. She is not ill-appearing.  HENT:     Head: Normocephalic and atraumatic.  Cardiovascular:     Rate and Rhythm: Normal rate and regular rhythm.     Pulses: Normal pulses.     Heart sounds: Normal heart sounds.  Pulmonary:     Effort: Pulmonary effort is normal. No respiratory distress.     Breath sounds: Normal breath sounds. No wheezing, rhonchi or rales.  Skin:    General: Skin is warm and dry.  Neurological:     Mental Status: She is alert and oriented to person, place, and time.  Psychiatric:        Mood and Affect: Mood normal.  Behavior: Behavior normal.        Thought Content: Thought content normal.        Judgment: Judgment normal.   No results found for this or any previous visit (from the past 24 hour(s)).     The ASCVD Risk score (Arnett DK, et al., 2019) failed to calculate for the following reasons:   The 2019 ASCVD risk score is only valid for ages 70 to 20   Assessment & Plan:   1. Anxiety with depression Discontinue Lamictal.  Added to intolerance list.  Increasing Zoloft to 50 mg daily.  Plan to follow-up virtually in about 4 weeks to see how this is working.  Return in about 4 weeks (around 03/21/2023) for mood follow up.  ___________________________________________ Clearnce Sorrel, DNP, APRN, FNP-BC [facility]

## 2023-03-21 ENCOUNTER — Telehealth (INDEPENDENT_AMBULATORY_CARE_PROVIDER_SITE_OTHER): Payer: No Typology Code available for payment source | Admitting: Medical-Surgical

## 2023-03-21 ENCOUNTER — Encounter: Payer: Self-pay | Admitting: Medical-Surgical

## 2023-03-21 DIAGNOSIS — F418 Other specified anxiety disorders: Secondary | ICD-10-CM

## 2023-03-21 NOTE — Progress Notes (Signed)
Virtual Visit via Video Note  I connected with Barbara Barnett on 03/21/23 at  1:20 PM EDT by a video enabled telemedicine application and verified that I am speaking with the correct person using two identifiers.   I discussed the limitations of evaluation and management by telemedicine and the availability of in person appointments. The patient expressed understanding and agreed to proceed.  Patient location: home Provider locations: office  Subjective:    CC: mood follow up  HPI: Very pleasant 32 year old female presenting via MyChart video visit to follow-up on mood.  At her last visit, she had been taking Zoloft 25 mg daily but had stopped it approximately 1 week before.  At that appointment, we discussed restarting Zoloft with ultimate increase to 50 mg daily.  Today, she reports that she has not started the new dose.  She is having episodes of mild dizziness with racing heart that is accompanied by anxiety to start with or shortly following the symptoms.  Is not currently taking anything for mood management and notes that she is having difficulty with the level of stress and anxiety.  Wanted to talk with me about starting the medication again.  She does have it on hand.  Denies SI/HI.   Past medical history, Surgical history, Family history not pertinant except as noted below, Social history, Allergies, and medications have been entered into the medical record, reviewed, and corrections made.   Review of Systems: See HPI for pertinent positives and negatives.   Objective:    General: Speaking clearly in complete sentences without any shortness of breath.  Alert and oriented x3.  Normal judgment. No apparent acute distress.  Impression and Recommendations:    1. Anxiety with depression Discussed the importance of managing mental health concerns especially during pregnancy for the best maternal and fetal health.  Feel that her elevated heart rate and dizziness episodes are directly  related to anxiety rather than a medication side effect.  Would like for her to restart Zoloft at 25 mg daily for 1 week then in creased to 50 mg daily if well-tolerated.  Advised her to reach out to me via MyChart or phone at any time should she feel concerning symptoms or have questions.  Plan to follow-up very closely in 2 weeks (virtually is fine).  I discussed the assessment and treatment plan with the patient. The patient was provided an opportunity to ask questions and all were answered. The patient agreed with the plan and demonstrated an understanding of the instructions.   The patient was advised to call back or seek an in-person evaluation if the symptoms worsen or if the condition fails to improve as anticipated.  25 minutes of non-face-to-face time was provided during this encounter.  Return in about 2 weeks (around 04/04/2023) for mood follow up.  Thayer Ohm, DNP, APRN, FNP-BC [facility] and [facility]

## 2023-03-22 NOTE — Progress Notes (Signed)
Called left vm for patient to call back and schedule a 2 week follow up appointment for mood

## 2023-04-05 ENCOUNTER — Telehealth (INDEPENDENT_AMBULATORY_CARE_PROVIDER_SITE_OTHER): Payer: No Typology Code available for payment source | Admitting: Medical-Surgical

## 2023-04-05 ENCOUNTER — Encounter: Payer: Self-pay | Admitting: Medical-Surgical

## 2023-04-05 DIAGNOSIS — F418 Other specified anxiety disorders: Secondary | ICD-10-CM

## 2023-04-05 NOTE — Progress Notes (Signed)
Virtual Visit via Video Note  I connected with Barbara Barnett on 04/05/23 at 11:10 AM EDT by a video enabled telemedicine application and verified that I am speaking with the correct person using two identifiers.   I discussed the limitations of evaluation and management by telemedicine and the availability of in person appointments. The patient expressed understanding and agreed to proceed.  Patient location: home Provider locations: office  Subjective:    CC: Mood follow-up  HPI: Pleasant 32 year old female presenting today for follow up on anxiety and depression. At our last visit, we discussed restarting Zoloft at 25mg  daily with increase to 50mg  after 1 week if tolerated well.  Today, she reports that she never did start the Zoloft back.  When she took it previously, it seemed to make her fatigued and groggy.  She was already having some significant issues with fatigue and grogginess.  Recently found out that her iron levels were drastically low and has been started on a daily oral iron supplement.  Has been on this for approximately 1 week so has not seen benefit to it yet.  Reports that her anxiety/depression symptoms are actually manageable right now and she would like to wait until after her baby is born to consider starting Zoloft or a comparable medication.  Doing counseling once every 2 weeks and finds this has been helpful.  Denies SI/HI.   Past medical history, Surgical history, Family history not pertinant except as noted below, Social history, Allergies, and medications have been entered into the medical record, reviewed, and corrections made.   Review of Systems: See HPI for pertinent positives and negatives.   Objective:    General: Speaking clearly in complete sentences without any shortness of breath.  Alert and oriented x3.  Normal judgment. No apparent acute distress.  Impression and Recommendations:    1. Anxiety with depression Hold off on starting Zoloft per  patient request.  Advised that if she does start the medication and it continues to cause significant grogginess or fatigue, she should reach out to me as soon as possible so we can discuss an alternative.  May do better with a different SSRI.  Once her baby is born, if she does not start the medication as prescribed and tolerates it well, would like her to follow-up at 4 to 6 weeks to evaluate response and tolerance.  Continue counseling as instructed.  I discussed the assessment and treatment plan with the patient. The patient was provided an opportunity to ask questions and all were answered. The patient agreed with the plan and demonstrated an understanding of the instructions.   The patient was advised to call back or seek an in-person evaluation if the symptoms worsen or if the condition fails to improve as anticipated.  25 minutes of non-face-to-face time was provided during this encounter.  Return if symptoms worsen or fail to improve.  Thayer Ohm, DNP, APRN, FNP-BC [facility] and [facility] Medicine

## 2023-04-29 ENCOUNTER — Other Ambulatory Visit: Payer: Self-pay | Admitting: Medical-Surgical

## 2023-05-18 ENCOUNTER — Inpatient Hospital Stay (HOSPITAL_COMMUNITY)
Admission: AD | Admit: 2023-05-18 | Discharge: 2023-05-20 | DRG: 806 | Disposition: A | Discharge status: Home / Self Care | Payer: No Typology Code available for payment source | Attending: Obstetrics and Gynecology | Admitting: Obstetrics and Gynecology

## 2023-05-18 ENCOUNTER — Inpatient Hospital Stay (HOSPITAL_COMMUNITY): Payer: No Typology Code available for payment source | Admitting: Anesthesiology

## 2023-05-18 ENCOUNTER — Encounter (HOSPITAL_COMMUNITY): Payer: Self-pay | Admitting: Obstetrics and Gynecology

## 2023-05-18 ENCOUNTER — Other Ambulatory Visit: Payer: Self-pay

## 2023-05-18 DIAGNOSIS — O9832 Other infections with a predominantly sexual mode of transmission complicating childbirth: Secondary | ICD-10-CM | POA: Diagnosis present

## 2023-05-18 DIAGNOSIS — A6000 Herpesviral infection of urogenital system, unspecified: Secondary | ICD-10-CM | POA: Diagnosis present

## 2023-05-18 DIAGNOSIS — O99354 Diseases of the nervous system complicating childbirth: Secondary | ICD-10-CM | POA: Diagnosis present

## 2023-05-18 DIAGNOSIS — O99824 Streptococcus B carrier state complicating childbirth: Secondary | ICD-10-CM | POA: Diagnosis present

## 2023-05-18 DIAGNOSIS — O99214 Obesity complicating childbirth: Secondary | ICD-10-CM | POA: Diagnosis present

## 2023-05-18 DIAGNOSIS — D509 Iron deficiency anemia, unspecified: Secondary | ICD-10-CM | POA: Diagnosis present

## 2023-05-18 DIAGNOSIS — Z3A39 39 weeks gestation of pregnancy: Secondary | ICD-10-CM | POA: Diagnosis not present

## 2023-05-18 DIAGNOSIS — G35 Multiple sclerosis: Secondary | ICD-10-CM | POA: Diagnosis present

## 2023-05-18 DIAGNOSIS — D573 Sickle-cell trait: Secondary | ICD-10-CM | POA: Diagnosis present

## 2023-05-18 DIAGNOSIS — O1404 Mild to moderate pre-eclampsia, complicating childbirth: Secondary | ICD-10-CM | POA: Diagnosis present

## 2023-05-18 DIAGNOSIS — O26893 Other specified pregnancy related conditions, third trimester: Secondary | ICD-10-CM | POA: Diagnosis present

## 2023-05-18 DIAGNOSIS — Z87891 Personal history of nicotine dependence: Secondary | ICD-10-CM | POA: Diagnosis not present

## 2023-05-18 DIAGNOSIS — O9902 Anemia complicating childbirth: Secondary | ICD-10-CM | POA: Diagnosis present

## 2023-05-18 LAB — CBC
HCT: 27.6 % — ABNORMAL LOW (ref 36.0–46.0)
Hemoglobin: 8.7 g/dL — ABNORMAL LOW (ref 12.0–15.0)
MCH: 23 pg — ABNORMAL LOW (ref 26.0–34.0)
MCHC: 31.5 g/dL (ref 30.0–36.0)
MCV: 72.8 fL — ABNORMAL LOW (ref 80.0–100.0)
Platelets: 287 10*3/uL (ref 150–400)
RBC: 3.79 MIL/uL — ABNORMAL LOW (ref 3.87–5.11)
RDW: 19.3 % — ABNORMAL HIGH (ref 11.5–15.5)
WBC: 8.2 10*3/uL (ref 4.0–10.5)
nRBC: 0.4 % — ABNORMAL HIGH (ref 0.0–0.2)

## 2023-05-18 LAB — OB RESULTS CONSOLE GBS: GBS: POSITIVE

## 2023-05-18 LAB — TYPE AND SCREEN
ABO/RH(D): B POS
Antibody Screen: NEGATIVE

## 2023-05-18 LAB — RPR: RPR Ser Ql: NONREACTIVE

## 2023-05-18 LAB — ABO/RH: ABO/RH(D): B POS

## 2023-05-18 MED ORDER — LACTATED RINGERS IV SOLN: 500.0000 mL | INTRAVENOUS | Status: DC | PRN

## 2023-05-18 MED ORDER — WITCH HAZEL-GLYCERIN EX PADS: 1.0000 | MEDICATED_PAD | CUTANEOUS | Status: DC | PRN

## 2023-05-18 MED ORDER — PHENYLEPHRINE 80 MCG/ML (10ML) SYRINGE FOR IV PUSH (FOR BLOOD PRESSURE SUPPORT): 80.0000 ug | PREFILLED_SYRINGE | INTRAVENOUS | Status: DC | PRN

## 2023-05-18 MED ORDER — SODIUM CHLORIDE 0.9 % IV SOLN
5.0000 10*6.[IU] | Freq: Once | INTRAVENOUS | Status: AC
  Administered 2023-05-18: 5 10*6.[IU] via INTRAVENOUS
  Filled 2023-05-18: qty 5

## 2023-05-18 MED ORDER — DIBUCAINE (PERIANAL) 1 % EX OINT: 1.0000 | TOPICAL_OINTMENT | CUTANEOUS | Status: DC | PRN

## 2023-05-18 MED ORDER — TERBUTALINE SULFATE 1 MG/ML IJ SOLN: 0.2500 mg | Freq: Once | INTRAMUSCULAR | Status: DC | PRN

## 2023-05-18 MED ORDER — DIPHENHYDRAMINE HCL 50 MG/ML IJ SOLN: 12.5000 mg | INTRAMUSCULAR | Status: DC | PRN

## 2023-05-18 MED ORDER — LIDOCAINE HCL (PF) 1 % IJ SOLN
INTRAMUSCULAR | Status: DC | PRN
  Administered 2023-05-18 (×2): 4 mL via EPIDURAL

## 2023-05-18 MED ORDER — PENICILLIN G POT IN DEXTROSE 60000 UNIT/ML IV SOLN
3.0000 10*6.[IU] | INTRAVENOUS | Status: DC
  Administered 2023-05-18: 3 10*6.[IU] via INTRAVENOUS
  Filled 2023-05-18 (×2): qty 50

## 2023-05-18 MED ORDER — OXYTOCIN BOLUS FROM INFUSION
333.0000 mL | Freq: Once | INTRAVENOUS | Status: AC
  Administered 2023-05-18: 333 mL via INTRAVENOUS

## 2023-05-18 MED ORDER — OXYCODONE-ACETAMINOPHEN 5-325 MG PO TABS: 1.0000 | ORAL_TABLET | ORAL | Status: DC | PRN

## 2023-05-18 MED ORDER — COCONUT OIL OIL: 1.0000 | TOPICAL_OIL | Status: DC | PRN

## 2023-05-18 MED ORDER — SOD CITRATE-CITRIC ACID 500-334 MG/5ML PO SOLN: 30.0000 mL | ORAL | Status: DC | PRN

## 2023-05-18 MED ORDER — LIDOCAINE HCL (PF) 1 % IJ SOLN: 30.0000 mL | INTRAMUSCULAR | Status: DC | PRN

## 2023-05-18 MED ORDER — FLEET ENEMA 7-19 GM/118ML RE ENEM: 1.0000 | ENEMA | RECTAL | Status: DC | PRN

## 2023-05-18 MED ORDER — ONDANSETRON HCL 4 MG/2ML IJ SOLN: 4.0000 mg | INTRAMUSCULAR | Status: DC | PRN

## 2023-05-18 MED ORDER — ONDANSETRON HCL 4 MG PO TABS: 4.0000 mg | ORAL_TABLET | ORAL | Status: DC | PRN

## 2023-05-18 MED ORDER — ACETAMINOPHEN 325 MG PO TABS
650.0000 mg | ORAL_TABLET | ORAL | Status: DC | PRN
  Administered 2023-05-18 – 2023-05-19 (×3): 650 mg via ORAL
  Filled 2023-05-18 (×4): qty 2

## 2023-05-18 MED ORDER — BENZOCAINE-MENTHOL 20-0.5 % EX AERO: 1.0000 | INHALATION_SPRAY | CUTANEOUS | Status: DC | PRN

## 2023-05-18 MED ORDER — ONDANSETRON HCL 4 MG/2ML IJ SOLN: 4.0000 mg | Freq: Four times a day (QID) | INTRAMUSCULAR | Status: DC | PRN

## 2023-05-18 MED ORDER — EPHEDRINE 5 MG/ML INJ: 10.0000 mg | INTRAVENOUS | Status: DC | PRN

## 2023-05-18 MED ORDER — OXYTOCIN-SODIUM CHLORIDE 30-0.9 UT/500ML-% IV SOLN: 2.5000 [IU]/h | INTRAVENOUS | Status: DC

## 2023-05-18 MED ORDER — ZOLPIDEM TARTRATE 5 MG PO TABS: 5.0000 mg | ORAL_TABLET | Freq: Every evening | ORAL | Status: DC | PRN

## 2023-05-18 MED ORDER — SIMETHICONE 80 MG PO CHEW: 80.0000 mg | CHEWABLE_TABLET | ORAL | Status: DC | PRN

## 2023-05-18 MED ORDER — ACETAMINOPHEN 325 MG PO TABS: 650.0000 mg | ORAL_TABLET | ORAL | Status: DC | PRN

## 2023-05-18 MED ORDER — SENNOSIDES-DOCUSATE SODIUM 8.6-50 MG PO TABS
2.0000 | ORAL_TABLET | Freq: Every day | ORAL | Status: DC
  Administered 2023-05-19 – 2023-05-20 (×2): 2 via ORAL
  Filled 2023-05-18 (×2): qty 2

## 2023-05-18 MED ORDER — IBUPROFEN 600 MG PO TABS
600.0000 mg | ORAL_TABLET | Freq: Four times a day (QID) | ORAL | Status: DC
  Administered 2023-05-18 – 2023-05-20 (×8): 600 mg via ORAL
  Filled 2023-05-18 (×8): qty 1

## 2023-05-18 MED ORDER — DIPHENHYDRAMINE HCL 25 MG PO CAPS: 25.0000 mg | ORAL_CAPSULE | Freq: Four times a day (QID) | ORAL | Status: DC | PRN

## 2023-05-18 MED ORDER — PRENATAL MULTIVITAMIN CH
1.0000 | ORAL_TABLET | Freq: Every day | ORAL | Status: DC
  Administered 2023-05-19 – 2023-05-20 (×2): 1 via ORAL
  Filled 2023-05-18 (×2): qty 1

## 2023-05-18 MED ORDER — LACTATED RINGERS IV SOLN: 500.0000 mL | Freq: Once | INTRAVENOUS | Status: DC

## 2023-05-18 MED ORDER — OXYTOCIN-SODIUM CHLORIDE 30-0.9 UT/500ML-% IV SOLN
1.0000 m[IU]/min | INTRAVENOUS | Status: DC
  Administered 2023-05-18: 2 m[IU]/min via INTRAVENOUS
  Filled 2023-05-18: qty 500

## 2023-05-18 MED ORDER — POLYSACCHARIDE IRON COMPLEX 150 MG PO CAPS: 150.0000 mg | ORAL_CAPSULE | Freq: Every day | ORAL | Status: DC

## 2023-05-18 MED ORDER — LACTATED RINGERS IV SOLN: INTRAVENOUS | Status: DC

## 2023-05-18 MED ORDER — FENTANYL CITRATE (PF) 100 MCG/2ML IJ SOLN
100.0000 ug | Freq: Once | INTRAMUSCULAR | Status: AC
  Administered 2023-05-18: 100 ug via INTRAVENOUS
  Filled 2023-05-18: qty 2

## 2023-05-18 MED ORDER — FERROUS SULFATE 325 (65 FE) MG PO TABS
325.0000 mg | ORAL_TABLET | Freq: Two times a day (BID) | ORAL | Status: DC
  Administered 2023-05-19 – 2023-05-20 (×3): 325 mg via ORAL
  Filled 2023-05-18 (×3): qty 1

## 2023-05-18 MED ORDER — FENTANYL-BUPIVACAINE-NACL 0.5-0.125-0.9 MG/250ML-% EP SOLN
12.0000 mL/h | EPIDURAL | Status: DC | PRN
  Administered 2023-05-18: 12 mL/h via EPIDURAL
  Filled 2023-05-18: qty 250

## 2023-05-18 MED ORDER — OXYCODONE-ACETAMINOPHEN 5-325 MG PO TABS: 2.0000 | ORAL_TABLET | ORAL | Status: DC | PRN

## 2023-05-18 NOTE — H&P (Signed)
Barbara Barnett is Barbara 32 y.o. female presenting @ 39 5/[redacted] week gestation in labor. GBS cx (+) denies SOM. Hx HSV no prodromal or recent outbreak. OB History     Gravida  2   Para  1   Term  1   Preterm      AB      Living  1      SAB      IAB      Ectopic      Multiple      Live Births  1          Past Medical History:  Diagnosis Date   Anxiety 110   Comes and goes on bad days i have anxiety attacks   Depression 2015   It comes and goes   HSV-2 infection    Multiple sclerosis ([facility])    Past Surgical History:  Procedure Laterality Date   APPENDECTOMY     LAPAROSCOPIC APPENDECTOMY N/Barbara 04/28/2020   Procedure: APPENDECTOMY LAPAROSCOPIC;  Surgeon: Barbara Rudd, MD;  Location: [facility];  Service: General;  Laterality: N/Barbara;   Family History: family history includes Depression in her father; Diabetes in her father and paternal uncle; Diabetes type II in her father; Healthy in her mother; Hypertension in her father and paternal uncle; Stroke in her father. Social History:  reports that she has quit smoking. Her smoking use included e-cigarettes. She has never used smokeless tobacco. She reports that she does not currently use alcohol after Barbara past usage of about 6.0 standard drinks of alcohol per week. She reports that she does not use drugs.     Maternal Diabetes: No Genetic Screening: Normal Maternal Ultrasounds/Referrals: Normal Fetal Ultrasounds or other Referrals:  None Maternal Substance Abuse:  No Significant Maternal Medications:  Meds include: Other:  Significant Maternal Lab Results:  Other:  Number of Prenatal Visits:greater than 3 verified prenatal visits Other Comments:   MS   hx HSV, SS trait  Review of Systems  All other systems reviewed and are negative.  History Dilation: 4 Effacement (%): 70 Station: -3 Exam by:: Barbara Barnett Blood pressure 124/71, pulse 82, temperature 98.4 F (36.9 C), temperature source Oral, resp. rate 18, last menstrual  period 08/14/2022, SpO2 100 %. Exam Physical Exam Constitutional:      Appearance: Normal appearance.  HENT:     Head: Atraumatic.  Eyes:     Extraocular Movements: Extraocular movements intact.  Cardiovascular:     Rate and Rhythm: Regular rhythm.     Heart sounds: Normal heart sounds.  Pulmonary:     Breath sounds: Normal breath sounds.  Abdominal:     Comments: Gravid   Musculoskeletal:        General: Normal range of motion.     Cervical back: Neck supple.  Skin:    General: Skin is warm and dry.  Neurological:     General: No focal deficit present.     Mental Status: She is alert and oriented to person, place, and time.  Psychiatric:        Mood and Affect: Mood normal.        Behavior: Behavior normal.     Prenatal labs: ABO, Rh:  B positive Antibody:  negative Rubella:  Immune RPR:   NR HBsAg:   neg HIV:   Neg GBS:   positive  Assessment/Plan: Labor GBS cx (+) Term MS  Hx HSV P) admit routine labs. IV PCN. Epidural. Pitocin augmentation prn   Barbara Barnett Barbara Barnett  05/18/2023, 9:39 AM

## 2023-05-18 NOTE — Progress Notes (Signed)
S: called by RN regarding pt 8-9 cm with urge to push On arrival pt c/o rectal pressure. Also c/o nausea  O:  VS BP 128/79 P117  VE fully +2 station Light mec fluid  Tracing; baseline 145 (+) good variability Ctx q 2 mins  IMP; complete  GBS cx (+) on IV PCN P) start pushing

## 2023-05-18 NOTE — MAU Note (Signed)
Barbara Barnett is a 32 y.o. at [redacted]w[redacted]d here in MAU reporting: she's having ctxs that are 5-7 minutes apart that began last night.  Denies VB or LOF.  Endorses +FM. LMP: NA Onset of complaint: today Pain score: 9 Vitals:   05/18/23 0838 05/18/23 0846  BP: (!) 125/90 124/71  Pulse: 91 82  Resp: 18 18  Temp:  98.4 F (36.9 C)  SpO2:  99%     FHT:155 bpm Lab orders placed from triage:   None

## 2023-05-18 NOTE — Anesthesia Preprocedure Evaluation (Signed)
Anesthesia Evaluation  Patient identified by MRN, date of birth, ID band Patient awake    Reviewed: Allergy & Precautions, Patient's Chart, lab work & pertinent test results  Airway Mallampati: II  TM Distance: >3 FB     Dental no notable dental hx. (+) Teeth Intact   Pulmonary former smoker   Pulmonary exam normal breath sounds clear to auscultation       Cardiovascular negative cardio ROS Normal cardiovascular exam Rhythm:Regular Rate:Normal     Neuro/Psych  PSYCHIATRIC DISORDERS Anxiety Depression    MS    GI/Hepatic Neg liver ROS,GERD  ,,  Endo/Other  Obesity  Renal/GU negative Renal ROS  negative genitourinary   Musculoskeletal  (+) Arthritis ,    Abdominal  (+) + obese  Peds  Hematology  (+) Blood dyscrasia, anemia   Anesthesia Other Findings   Reproductive/Obstetrics (+) Pregnancy                              Anesthesia Physical Anesthesia Plan  ASA: 2  Anesthesia Plan: Epidural   Post-op Pain Management:    Induction: Intravenous  PONV Risk Score and Plan:   Airway Management Planned: Natural Airway  Additional Equipment: None and Fetal Monitoring  Intra-op Plan:   Post-operative Plan:   Informed Consent: I have reviewed the patients History and Physical, chart, labs and discussed the procedure including the risks, benefits and alternatives for the proposed anesthesia with the patient or authorized representative who has indicated his/her understanding and acceptance.       Plan Discussed with: Anesthesiologist  Anesthesia Plan Comments:          Anesthesia Quick Evaluation

## 2023-05-18 NOTE — Anesthesia Procedure Notes (Signed)
Epidural Patient location during procedure: OB Start time: 05/18/2023 12:09 PM End time: 05/18/2023 12:17 PM  Staffing Anesthesiologist: Mal Amabile, MD Performed: anesthesiologist   Preanesthetic Checklist Completed: patient identified, IV checked, site marked, risks and benefits discussed, surgical consent, monitors and equipment checked, pre-op evaluation and timeout performed  Epidural Patient position: sitting Prep: DuraPrep and site prepped and draped Patient monitoring: continuous pulse ox and blood pressure Approach: midline Location: L3-L4 Injection technique: LOR air  Needle:  Needle type: Tuohy  Needle gauge: 17 G Needle length: 9 cm and 9 Needle insertion depth: 4 cm Catheter type: closed end flexible Catheter size: 19 Gauge Catheter at skin depth: 9 cm Test dose: negative and Other  Assessment Events: blood not aspirated, no cerebrospinal fluid, injection not painful, no injection resistance, no paresthesia and negative IV test  Additional Notes Patient identified. Risks and benefits discussed including failed block, incomplete  Pain control, post dural puncture headache, nerve damage, paralysis, blood pressure Changes, nausea, vomiting, reactions to medications-both toxic and allergic and post Partum back pain. All questions were answered. Patient expressed understanding and wished to proceed. Sterile technique was used throughout procedure. Epidural site was Dressed with sterile barrier dressing. No paresthesias, signs of intravascular injection Or signs of intrathecal spread were encountered.  Patient was more comfortable after the epidural was dosed. Please see RN's note for documentation of vital signs and FHR which are stable. Reason for block:procedure for pain

## 2023-05-19 LAB — CBC
HCT: 23.4 % — ABNORMAL LOW (ref 36.0–46.0)
Hemoglobin: 7.4 g/dL — ABNORMAL LOW (ref 12.0–15.0)
MCH: 22.8 pg — ABNORMAL LOW (ref 26.0–34.0)
MCHC: 31.6 g/dL (ref 30.0–36.0)
MCV: 72 fL — ABNORMAL LOW (ref 80.0–100.0)
Platelets: 226 10*3/uL (ref 150–400)
RBC: 3.25 MIL/uL — ABNORMAL LOW (ref 3.87–5.11)
RDW: 19.2 % — ABNORMAL HIGH (ref 11.5–15.5)
WBC: 11.4 10*3/uL — ABNORMAL HIGH (ref 4.0–10.5)
nRBC: 0.3 % — ABNORMAL HIGH (ref 0.0–0.2)

## 2023-05-19 LAB — COMPREHENSIVE METABOLIC PANEL
ALT: 14 U/L (ref 0–44)
AST: 19 U/L (ref 15–41)
Albumin: 2.2 g/dL — ABNORMAL LOW (ref 3.5–5.0)
Alkaline Phosphatase: 135 U/L — ABNORMAL HIGH (ref 38–126)
Anion gap: 12 (ref 5–15)
BUN: <5 mg/dL — ABNORMAL LOW (ref 6–20)
CO2: 22 mmol/L (ref 22–32)
Calcium: 9 mg/dL (ref 8.9–10.3)
Chloride: 106 mmol/L (ref 98–111)
Creatinine, Ser: 0.79 mg/dL (ref 0.44–1.00)
GFR, Estimated: >60 mL/min (ref >60)
Glucose, Bld: 89 mg/dL (ref 70–99)
Potassium: 3.6 mmol/L (ref 3.5–5.1)
Sodium: 140 mmol/L (ref 135–145)
Total Bilirubin: 0.3 mg/dL (ref 0.3–1.2)
Total Protein: 4.9 g/dL — ABNORMAL LOW (ref 6.5–8.1)

## 2023-05-19 LAB — PROTEIN / CREATININE RATIO, URINE
Creatinine, Urine: 66 mg/dL
Protein Creatinine Ratio: 0.89 mg/mg{Cre} — ABNORMAL HIGH (ref 0.00–0.15)
Total Protein, Urine: 59 mg/dL

## 2023-05-19 MED ORDER — SODIUM CHLORIDE 0.9 % IV SOLN
500.0000 mg | Freq: Once | INTRAVENOUS | Status: AC
  Administered 2023-05-19: 500 mg via INTRAVENOUS
  Filled 2023-05-19: qty 25

## 2023-05-19 MED ORDER — IRON SUCROSE 500 MG IVPB - SIMPLE MED
500.0000 mg | Freq: Once | INTRAVENOUS | Status: DC
  Filled 2023-05-19: qty 275
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
## 2023-05-19 NOTE — Anesthesia Postprocedure Evaluation (Signed)
Anesthesia Post Note  Patient: Barbara Barnett  Procedure(s) Performed: AN AD HOC LABOR EPIDURAL     Patient location during evaluation: Mother Baby Anesthesia Type: Epidural Level of consciousness: awake and alert Pain management: pain level controlled Vital Signs Assessment: post-procedure vital signs reviewed and stable Respiratory status: spontaneous breathing, nonlabored ventilation and respiratory function stable Cardiovascular status: stable Postop Assessment: no headache, no backache and epidural receding Anesthetic complications: no   No notable events documented.  Last Vitals:  Vitals:   05/18/23 2318 05/19/23 0324  BP: 112/79 117/77  Pulse: 82 92  Resp: 18 18  Temp: 37.4 C 36.7 C  SpO2: 99% 97%    Last Pain:  Vitals:   05/19/23 0324  TempSrc: Oral  PainSc: 5                  Takila Kronberg

## 2023-05-19 NOTE — Progress Notes (Signed)
PPD1 SVD:   S:  Pt reports feeling well. Denies h/a ,visual changes or epigastric pain/ Tolerating po/ Voiding without problems/ No n/v/ Bleeding is light/ Pain controlled withprescription NSAID's including ibuprofen (Motrin)  Newborn info live female   O:  A & O x 3 / VS: Blood pressure (!) 132/92, pulse 81, temperature 98.7 F (37.1 C), temperature source Oral, resp. rate 18, height 5' (1.524 m), weight 71.7 kg, last menstrual period 08/14/2022, SpO2 98 %, unknown if currently breastfeeding.  LABS:  Results for orders placed or performed during the hospital encounter of 05/18/23 (from the past 24 hour(s))  CBC     Status: Abnormal   Collection Time: 05/19/23  4:44 AM  Result Value Ref Range   WBC 11.4 (H) 4.0 - 10.5 K/uL   RBC 3.25 (L) 3.87 - 5.11 MIL/uL   Hemoglobin 7.4 (L) 12.0 - 15.0 g/dL   HCT 11.9 (L) 14.7 - 82.9 %   MCV 72.0 (L) 80.0 - 100.0 fL   MCH 22.8 (L) 26.0 - 34.0 pg   MCHC 31.6 30.0 - 36.0 g/dL   RDW 56.2 (H) 13.0 - 86.5 %   Platelets 226 150 - 400 K/uL   nRBC 0.3 (H) 0.0 - 0.2 %  Patient Vitals for the past 24 hrs:  BP Temp Temp src Pulse Resp SpO2  05/19/23 1241 (!) 132/92 -- -- -- -- --  05/19/23 1208 (!) 114/96 98.7 F (37.1 C) Oral 81 -- 98 %  05/19/23 0324 117/77 98.1 F (36.7 C) Oral 92 18 97 %  05/18/23 2318 112/79 99.3 F (37.4 C) Oral 82 18 99 %  05/18/23 1945 (!) 147/89 -- Oral 100 18 100 %     I&O: I/O last 3 completed shifts: In: 0  Out: 901 [Urine:800; Blood:101]   No intake/output data recorded.  Lungs: chest clear, no wheezing, rales, normal symmetric air entry  Heart: regular rate and rhythm, S1, S2 normal, no murmur, click, rub or gallop  Abdomen: uterus firm at umb  Perineum: is normal  Lochia: light  Extremities:no redness or tenderness in the calves or thighs, edema tr+    A/P: PPD # 1/ [id number]  Doing well  Continue routine post partum orders  PIH labs done

## 2023-05-19 NOTE — Lactation Note (Signed)
This note was copied from a baby's chart. Lactation Consultation Note  Patient Name: Barbara Barnett [id number] Date: 05/19/2023 Age:33 hours Reason for consult: Initial assessment;Term Went into rm. Parents awake trying to swaddle baby. LC assisted. Asked mom how BF was going mom stated she didn't have anything in her breast.. LC asked mom if I could check her colostrum mom stated yes. LC demonstrated hand expression and noted colostrum. Mom was happy. Baby cueing and hadn't eaten since 2100. LC put baby to the breast and he BF great. Mom excited. Newborn feeding habits, STS, I&O, support, props reviewed. Mom encouraged to feed baby 8-12 times/24 hours and with feeding cues.  Encouraged mom to call for assistance or questions as needed.  Maternal Data Has patient been taught Hand Expression?: Yes Does the patient have breastfeeding experience prior to this delivery?: No  Feeding Mother's Current Feeding Choice: Breast Milk and Formula Nipple Type: Slow - flow  LATCH Score Latch: Grasps breast easily, tongue down, lips flanged, rhythmical sucking.  Audible Swallowing: A few with stimulation  Type of Nipple: Everted at rest and after stimulation  Comfort (Breast/Nipple): Soft / non-tender  Hold (Positioning): Assistance needed to correctly position infant at breast and maintain latch.  LATCH Score: 8   Lactation Tools Discussed/Used Tools: Pump Breast pump type: Manual;Other (comment) (pt has her own electric pump as well)  Interventions Interventions: Breast feeding basics reviewed;Adjust position;Assisted with latch;Support pillows;Position options;Breast massage;Hand express;LC Services brochure;Breast compression  Discharge    Consult Status Consult Status: Follow-up Date: 05/19/23 (in pm) Follow-up type: In-patient    Barbara Barnett, Diamond Nickel 05/19/2023, 2:29 AM

## 2023-05-19 NOTE — Lactation Note (Signed)
This note was copied from a baby's chart. Lactation Consultation Note  Patient Name: Barbara Barnett [name] Date: 05/19/2023 Age:33 hours Reason for consult: Follow-up assessment;Term Mom stated the baby hadn't wanted to latch. He gets on the breast and cries acts as if he can't find the nipple so I'm going to so formula. LC stated the baby may have nipple confusion between the pacifiers and bottle they sometimes will refuse the breast because the bottle nipple sticks out further in their mouth and they don't have to work as hard as they do on the breast. Mom stated today hadn't been a good day.  Encouraged mom if she changes her mind to call for assistance.  Maternal Data    Feeding Mother's Current Feeding Choice: Breast Milk and Formula Nipple Type: Slow - flow  LATCH Score                    Lactation Tools Discussed/Used    Interventions    Discharge    Consult Status Consult Status: Complete Date: 05/19/23    Charyl Dancer 05/19/2023, 10:15 PM

## 2023-05-20 MED ORDER — IBUPROFEN 600 MG PO TABS: 600.0000 mg | ORAL_TABLET | Freq: Four times a day (QID) | ORAL | 3 refills | Status: AC | PRN

## 2023-05-20 MED ORDER — VALACYCLOVIR HCL 500 MG PO TABS: 500.0000 mg | ORAL_TABLET | Freq: Every day | ORAL | 3 refills | Status: AC

## 2023-05-20 NOTE — Discharge Instructions (Signed)
Call if temperature greater than equal to 100.4, nothing per vagina for 4-6 weeks or severe nausea vomiting, increased incisional pain , drainage or redness in the incision site, no straining with bowel movements, showers no bath °

## 2023-05-20 NOTE — Discharge Summary (Signed)
Postpartum Discharge Summary  Date of Service updated***     Patient Name: Barbara Barnett DOB: 09-22-1991 MRN: [id number]  Date of admission: 05/18/2023 Delivery date:05/18/2023  Delivering provider: Dathan Attia  Date of discharge: 05/20/2023  Admitting diagnosis: Normal labor [O80, Z37.9] Postpartum care following vaginal delivery [Z39.2] Intrauterine pregnancy: [redacted]w[redacted]d     Secondary diagnosis:  Principal Problem:   Normal labor Active Problems:   Postpartum care following vaginal delivery  Additional problems: ***    Discharge diagnosis: {DX.:23714}                                              Post partum procedures:{Postpartum procedures:23558} Augmentation: {Augmentation:20782} Complications: {OB Labor/Delivery Complications:20784}  Hospital course: {Courses:23701}  Magnesium Sulfate received: {Mag received:30440022} BMZ received: {BMZ received:30440023} Rhophylac:{Rhophylac received:30440032} MMR:{MMR:30440033} T-DaP:{Tdap:23962} Flu: {WUJ:81191} Transfusion:{Transfusion received:30440034}  Physical exam  Vitals:   05/19/23 1208 05/19/23 1241 05/19/23 2026 05/20/23 0530  BP: (!) 114/96 (!) 132/92 129/77 110/69  Pulse: 81  71 75  Resp:   18 18  Temp: 98.7 F (37.1 C)  98.8 F (37.1 C) 98.9 F (37.2 C)  TempSrc: Oral  Oral Oral  SpO2: 98%  99% 100%  Weight:      Height:       General: {Exam; general:21111117} Lochia: {Desc; appropriate/inappropriate:30686::"appropriate"} Uterine Fundus: {Desc; firm/soft:30687} Incision: {Exam; incision:21111123} DVT Evaluation: {Exam; dvt:2111122} Labs: Lab Results  Component Value Date   WBC 11.4 (H) 05/19/2023   HGB 7.4 (L) 05/19/2023   HCT 23.4 (L) 05/19/2023   MCV 72.0 (L) 05/19/2023   PLT 226 05/19/2023      Latest Ref Rng & Units 05/19/2023    7:25 PM  CMP  Glucose 70 - 99 mg/dL 89   BUN 6 - 20 mg/dL <5   Creatinine 4.78 - 1.00 mg/dL 2.95   Sodium 621 - 308 mmol/L 140   Potassium 3.5 - 5.1  mmol/L 3.6   Chloride 98 - 111 mmol/L 106   CO2 22 - 32 mmol/L 22   Calcium 8.9 - 10.3 mg/dL 9.0   Total Protein 6.5 - 8.1 g/dL 4.9   Total Bilirubin 0.3 - 1.2 mg/dL 0.3   Alkaline Phos 38 - 126 U/L 135   AST 15 - 41 U/L 19   ALT 0 - 44 U/L 14    Edinburgh Score:    05/19/2023    7:30 AM  Edinburgh Postnatal Depression Scale Screening Tool  I have been able to laugh and see the funny side of things. 0  I have looked forward with enjoyment to things. 1  I have blamed myself unnecessarily when things went wrong. 1  I have been anxious or worried for no good reason. 2  I have felt scared or panicky for no good reason. 2  Things have been getting on top of me. 2  I have been so unhappy that I have had difficulty sleeping. 1  I have felt sad or miserable. 2  I have been so unhappy that I have been crying. 2  The thought of harming myself has occurred to me. 0  Edinburgh Postnatal Depression Scale Total 13      After visit meds:  Allergies as of 05/20/2023   No Known Allergies      Medication List     STOP taking these medications    sertraline 50 MG  tablet Commonly known as: ZOLOFT       TAKE these medications    ibuprofen 600 MG tablet Commonly known as: ADVIL Take 1 tablet (600 mg total) by mouth every 6 (six) hours as needed.   iron polysaccharides 150 MG capsule Commonly known as: NIFEREX Take 150 mg by mouth daily.   PRENATAL ADULT GUMMY/DHA/FA PO Take 2 tablets by mouth daily.   valACYclovir 500 MG tablet Commonly known as: Valtrex Take 1 tablet (500 mg total) by mouth daily.         Discharge home in stable condition Infant Feeding: {Baby feeding:23562} Infant Disposition:{CHL IP OB HOME WITH WUJWJX:91478} Discharge instruction: per After Visit Summary and Postpartum booklet. Activity: Advance as tolerated. Pelvic rest for 6 weeks.  Diet: {OB diet:21111121} Anticipated Birth Control: {Birth Control:23956} Postpartum Appointment:{Outpatient  follow up:23559} Additional Postpartum F/U: {PP Procedure:23957} Future Appointments: Future Appointments  Date Time Provider Department Center  06/01/2023 10:45 AM [facility] None  06/01/2023 11:00 AM Erenest Blank, NP [facility] None  06/08/2023  8:45 AM Ihor Austin, NP [facility] None   Follow up Visit:  Follow-up Information     Maxie Better, MD Follow up in 6 week(s).   Specialty: Obstetrics and Gynecology Contact information: [address] [place] [address] [phone]                     05/20/2023 Serita Kyle, MD

## 2023-05-20 NOTE — Progress Notes (Signed)
PPD{NUMBERS 1-5:20334} SVD:   S:  Pt reports feeling ***/ Tolerating po/ Voiding without problems/ No n/v/ Bleeding is {Description; bleeding vaginal:11356}/ Pain controlled with{treatments; pain control med:13496}  Newborn info ***   O:  A & O x 3 ***/ VS: Blood pressure 110/69, pulse 75, temperature 98.9 F (37.2 C), temperature source Oral, resp. rate 18, height 5' (1.524 m), weight 71.7 kg, last menstrual period 08/14/2022, SpO2 100 %, unknown if currently breastfeeding.  LABS:  Results for orders placed or performed during the hospital encounter of 05/18/23 (from the past 24 hour(s))  Comprehensive metabolic panel     Status: Abnormal   Collection Time: 05/19/23  7:25 PM  Result Value Ref Range   Sodium 140 135 - 145 mmol/L   Potassium 3.6 3.5 - 5.1 mmol/L   Chloride 106 98 - 111 mmol/L   CO2 22 22 - 32 mmol/L   Glucose, Bld 89 70 - 99 mg/dL   BUN <5 (L) 6 - 20 mg/dL   Creatinine, Ser 2.95 0.44 - 1.00 mg/dL   Calcium 9.0 8.9 - 62.1 mg/dL   Total Protein 4.9 (L) 6.5 - 8.1 g/dL   Albumin 2.2 (L) 3.5 - 5.0 g/dL   AST 19 15 - 41 U/L   ALT 14 0 - 44 U/L   Alkaline Phosphatase 135 (H) 38 - 126 U/L   Total Bilirubin 0.3 0.3 - 1.2 mg/dL   GFR, Estimated >30 >86 mL/min   Anion gap 12 5 - 15  Protein / creatinine ratio, urine     Status: Abnormal   Collection Time: 05/19/23 10:14 PM  Result Value Ref Range   Creatinine, Urine 66 mg/dL   Total Protein, Urine 59 mg/dL   Protein Creatinine Ratio 0.89 (H) 0.00 - 0.15 mg/mg[Cre]    I&O: No intake/output data recorded.   No intake/output data recorded.  Lungs: {Exam; lungs:5033}  Heart: {Exam; heart:5510}  Abdomen: {PE ABDOMEN POSTPARTUM OBGYN:313106}  Perineum: {exam; perineum:10172}  Lochia: ***  Extremities:{pe extremities VH:846962}    A/P: PPD # ***/ [id number]  Doing well  Continue routine post partum orders  ***

## 2023-05-21 ENCOUNTER — Inpatient Hospital Stay (HOSPITAL_COMMUNITY)
Admission: AD | Admit: 2023-05-21 | Payer: No Typology Code available for payment source | Source: Home / Self Care | Admitting: Obstetrics and Gynecology

## 2023-05-31 ENCOUNTER — Other Ambulatory Visit: Payer: Self-pay | Admitting: Medical-Surgical

## 2023-05-31 ENCOUNTER — Other Ambulatory Visit: Payer: Self-pay | Admitting: Family

## 2023-05-31 DIAGNOSIS — D649 Anemia, unspecified: Secondary | ICD-10-CM

## 2023-06-01 ENCOUNTER — Inpatient Hospital Stay: Payer: No Typology Code available for payment source | Attending: Hematology & Oncology

## 2023-06-01 ENCOUNTER — Encounter: Payer: Self-pay | Admitting: Family

## 2023-06-01 ENCOUNTER — Other Ambulatory Visit: Payer: Self-pay | Admitting: Family

## 2023-06-01 ENCOUNTER — Inpatient Hospital Stay: Payer: No Typology Code available for payment source | Admitting: Family

## 2023-06-01 ENCOUNTER — Other Ambulatory Visit: Payer: Self-pay

## 2023-06-01 VITALS — BP 126/83 | HR 71 | Temp 98.0°F | Resp 17 | Ht 60.0 in | Wt 130.0 lb

## 2023-06-01 DIAGNOSIS — Z87891 Personal history of nicotine dependence: Secondary | ICD-10-CM | POA: Diagnosis not present

## 2023-06-01 DIAGNOSIS — Z79899 Other long term (current) drug therapy: Secondary | ICD-10-CM | POA: Insufficient documentation

## 2023-06-01 DIAGNOSIS — G35 Multiple sclerosis: Secondary | ICD-10-CM | POA: Diagnosis not present

## 2023-06-01 DIAGNOSIS — D573 Sickle-cell trait: Secondary | ICD-10-CM | POA: Insufficient documentation

## 2023-06-01 DIAGNOSIS — K5900 Constipation, unspecified: Secondary | ICD-10-CM | POA: Diagnosis not present

## 2023-06-01 DIAGNOSIS — Z791 Long term (current) use of non-steroidal anti-inflammatories (NSAID): Secondary | ICD-10-CM | POA: Diagnosis not present

## 2023-06-01 DIAGNOSIS — D509 Iron deficiency anemia, unspecified: Secondary | ICD-10-CM | POA: Diagnosis present

## 2023-06-01 DIAGNOSIS — Z79624 Long term (current) use of inhibitors of nucleotide synthesis: Secondary | ICD-10-CM | POA: Insufficient documentation

## 2023-06-01 DIAGNOSIS — D649 Anemia, unspecified: Secondary | ICD-10-CM

## 2023-06-01 DIAGNOSIS — Z331 Pregnant state, incidental: Secondary | ICD-10-CM | POA: Diagnosis not present

## 2023-06-01 LAB — CBC WITH DIFFERENTIAL (CANCER CENTER ONLY)
Abs Immature Granulocytes: 0.1 10*3/uL — ABNORMAL HIGH (ref 0.00–0.07)
Basophils Absolute: 0 10*3/uL (ref 0.0–0.1)
Basophils Relative: 0 %
Eosinophils Absolute: 0.1 10*3/uL (ref 0.0–0.5)
Eosinophils Relative: 2 %
HCT: 36.1 % (ref 36.0–46.0)
Hemoglobin: 11.2 g/dL — ABNORMAL LOW (ref 12.0–15.0)
Immature Granulocytes: 1 %
Lymphocytes Relative: 11 %
Lymphs Abs: 0.8 10*3/uL (ref 0.7–4.0)
MCH: 23.4 pg — ABNORMAL LOW (ref 26.0–34.0)
MCHC: 31 g/dL (ref 30.0–36.0)
MCV: 75.5 fL — ABNORMAL LOW (ref 80.0–100.0)
Monocytes Absolute: 0.5 10*3/uL (ref 0.1–1.0)
Monocytes Relative: 7 %
Neutro Abs: 5.6 10*3/uL (ref 1.7–7.7)
Neutrophils Relative %: 79 %
Platelet Count: 385 10*3/uL (ref 150–400)
RBC: 4.78 MIL/uL (ref 3.87–5.11)
RDW: 24.3 % — ABNORMAL HIGH (ref 11.5–15.5)
WBC Count: 7 10*3/uL (ref 4.0–10.5)
nRBC: 0 % (ref 0.0–0.2)

## 2023-06-01 LAB — RETICULOCYTES
Immature Retic Fract: 25.1 % — ABNORMAL HIGH (ref 2.3–15.9)
RBC.: 4.77 MIL/uL (ref 3.87–5.11)
Retic Count, Absolute: 64.9 10*3/uL (ref 19.0–186.0)
Retic Ct Pct: 1.4 % (ref 0.4–3.1)

## 2023-06-01 LAB — FERRITIN: Ferritin: 280 ng/mL (ref 11–307)

## 2023-06-01 LAB — IRON AND IRON BINDING CAPACITY (CC-WL,HP ONLY)
Iron: 44 ug/dL (ref 28–170)
Saturation Ratios: 9 % — ABNORMAL LOW (ref 10.4–31.8)
TIBC: 475 ug/dL — ABNORMAL HIGH (ref 250–450)
UIBC: 431 ug/dL (ref 148–442)

## 2023-06-01 LAB — SAMPLE TO BLOOD BANK

## 2023-06-01 NOTE — Progress Notes (Signed)
Hematology/Oncology Consultation   Name: Barbara Barnett      MRN: [id number]    Location: Room/bed info not found  Date: 06/01/2023 Time:11:32 AM   REFERRING PHYSICIAN: Maxie Better, MD  REASON FOR CONSULT:  Iron deficiency anemia, anemia complicating pregnancy in third trimester   DIAGNOSIS:  Iron deficiency anemia during pregnancy Sickle cell trait  HISTORY OF PRESENT ILLNESS: Barbara Barnett is a very pleasant 32 yo [place] American female with remote history of IDA and again recently with pregnancy.  She gave birth to her sweet baby boy on 05/18/2023. Hgb at discharge was 7.4, MCV 72.  She has been taking an oral iron supplement and will be starting daily folic acid 1 mg PO.  She also has the sickle cell trait.  Thankfully her Hgb today is much improved at 11.2, MCV 75, platelets 385 and WBC count 7.0.  She states that when she had her cycle prior to pregnancy her flow could be heavy.  Her post partum blood loss has slowed now to spotting.  No other blood loss noted.  No bruising or petechiae.  No known familial history of anemia.  She is breast feeding.  She notes some normal fatigue. No other symptoms at this time.  She has 2 children and no history of miscarriage.  No personal history of cancer. Her maternal aunt had liver cancer.  No history of diabetes or thyroid disease.  No fever, chills, n/v, cough, rash, dizziness, SOB, chest pain, palpitations, abdominal pain or changes in bowel or bladder habits.  She has constipation at times and plans to take Miralax as needed.  No swelling or tenderness in her extremities.  She has chronic numbness and tingling in the hands and feet secondary to MS. She typically on Ocrevus infusions every 6 months but this has been on hold during her pregnancy.  No falls or syncope reported.  Appetite and hydration are great. Weight is 130 lbs.  No smoking, ETOH or recreational drug use.  She plans to start going to the gym again.  She works for  [employer].   ROS: All other 10 point review of systems is negative.   PAST MEDICAL HISTORY:   Past Medical History:  Diagnosis Date   Anxiety 2   Comes and goes on bad days i have anxiety attacks   Depression 2015   It comes and goes   HSV-2 infection    Multiple sclerosis (HCC)     ALLERGIES: No Known Allergies    MEDICATIONS:  Current Outpatient Medications on File Prior to Visit  Medication Sig Dispense Refill   folic acid (FOLVITE) 1 MG tablet Take 4 mg by mouth daily.     ibuprofen (ADVIL) 600 MG tablet Take 1 tablet (600 mg total) by mouth every 6 (six) hours as needed. 30 tablet 3   iron polysaccharides (NIFEREX) 150 MG capsule Take 150 mg by mouth daily.     meloxicam (MOBIC) 15 MG tablet Take 15 mg by mouth daily.     Prenatal MV & Min w/FA-DHA (PRENATAL ADULT GUMMY/DHA/FA PO) Take 2 tablets by mouth daily.     sertraline (ZOLOFT) 50 MG tablet Take 50 mg by mouth daily.     terconazole (TERAZOL 7) 0.4 % vaginal cream SMARTSIG:1 Vaginal Daily     valACYclovir (VALTREX) 500 MG tablet Take 1 tablet (500 mg total) by mouth daily. 90 tablet 3   No current facility-administered medications on file prior to visit.     PAST SURGICAL HISTORY Past  Surgical History:  Procedure Laterality Date   APPENDECTOMY     LAPAROSCOPIC APPENDECTOMY N/A 04/28/2020   Procedure: APPENDECTOMY LAPAROSCOPIC;  Surgeon: Manus Rudd, MD;  Location: [facility] OR;  Service: General;  Laterality: N/A;    FAMILY HISTORY: Family History  Problem Relation Age of Onset   Healthy Mother    Diabetes type II Father    Stroke Father    Hypertension Father    Depression Father    Diabetes Father    Diabetes Paternal Uncle    Hypertension Paternal Uncle     SOCIAL HISTORY:  reports that she has quit smoking. Her smoking use included e-cigarettes. She has never used smokeless tobacco. She reports that she does not currently use alcohol after a past usage of about 6.0 standard drinks of alcohol per week.  She reports that she does not use drugs.  PERFORMANCE STATUS: The patient's performance status is 0 - Asymptomatic  PHYSICAL EXAM: Most Recent Vital Signs: Blood pressure 126/83, pulse 71, temperature 98 F (36.7 C), temperature source Oral, resp. rate 17, height 5' (1.524 m), weight 130 lb (59 kg), last menstrual period 08/14/2022, SpO2 97 %, unknown if currently breastfeeding. BP 126/83 (BP Location: Left Arm, Patient Position: Sitting)   Pulse 71   Temp 98 F (36.7 C) (Oral)   Resp 17   Ht 5' (1.524 m)   Wt 130 lb (59 kg)   LMP 08/14/2022   SpO2 97%   BMI 25.39 kg/m   General Appearance:    Alert, cooperative, no distress, appears stated age  Head:    Normocephalic, without obvious abnormality, atraumatic  Eyes:    PERRL, conjunctiva/corneas clear, EOM's intact, fundi    benign, both eyes        Throat:   Lips, mucosa, and tongue normal; teeth and gums normal  Neck:   Supple, symmetrical, trachea midline, no adenopathy;    thyroid:  no enlargement/tenderness/nodules; no carotid   bruit or JVD  Back:     Symmetric, no curvature, ROM normal, no CVA tenderness  Lungs:     Clear to auscultation bilaterally, respirations unlabored  Chest Wall:    No tenderness or deformity   Heart:    Regular rate and rhythm, S1 and S2 normal, no murmur, rub   or gallop     Abdomen:     Soft, non-tender, bowel sounds active all four quadrants,    no masses, no organomegaly        Extremities:   Extremities normal, atraumatic, no cyanosis or edema  Pulses:   2+ and symmetric all extremities  Skin:   Skin color, texture, turgor normal, no rashes or lesions  Lymph nodes:   Cervical, supraclavicular, and axillary nodes normal  Neurologic:   CNII-XII intact, normal strength, sensation and reflexes    throughout    LABORATORY DATA:  Results for orders placed or performed in visit on 06/01/23 (from the past 48 hour(s))  Reticulocytes     Status: Abnormal   Collection Time: 06/01/23 10:31 AM   Result Value Ref Range   Retic Ct Pct 1.4 0.4 - 3.1 %   RBC. 4.77 3.87 - 5.11 MIL/uL   Retic Count, Absolute 64.9 19.0 - 186.0 K/uL   Immature Retic Fract 25.1 (H) 2.3 - 15.9 %    Comment: Performed at [facility], [address], [place], [place] [address]  CBC with Differential (Cancer Center Only)     Status: Abnormal  Collection Time: 06/01/23 10:31 AM  Result Value Ref Range   WBC Count 7.0 4.0 - 10.5 K/uL   RBC 4.78 3.87 - 5.11 MIL/uL   Hemoglobin 11.2 (L) 12.0 - 15.0 g/dL   HCT 21.3 08.6 - 57.8 %   MCV 75.5 (L) 80.0 - 100.0 fL   MCH 23.4 (L) 26.0 - 34.0 pg   MCHC 31.0 30.0 - 36.0 g/dL   RDW 46.9 (H) 62.9 - 52.8 %   Platelet Count 385 150 - 400 K/uL   nRBC 0.0 0.0 - 0.2 %   Neutrophils Relative % 79 %   Neutro Abs 5.6 1.7 - 7.7 K/uL   Lymphocytes Relative 11 %   Lymphs Abs 0.8 0.7 - 4.0 K/uL   Monocytes Relative 7 %   Monocytes Absolute 0.5 0.1 - 1.0 K/uL   Eosinophils Relative 2 %   Eosinophils Absolute 0.1 0.0 - 0.5 K/uL   Basophils Relative 0 %   Basophils Absolute 0.0 0.0 - 0.1 K/uL   Immature Granulocytes 1 %   Abs Immature Granulocytes 0.10 (H) 0.00 - 0.07 K/uL    Comment: Performed at [facility] at [facility], [address] [place], [place] [address]      RADIOGRAPHY: No results found.     PATHOLOGY: None  ASSESSMENT/PLAN: Ms. Dobias is a very pleasant 32 yo [place] American female with remote history of IDA and again recently with pregnancy as well as carrying the sickle cell trait.  She is tolerating oral iron nicely and has marked improvement in her Hgb.  Iron studies are pending. We can replace if needed.  She will be starting daily folic acid 1 mg PO daily with PCP.  Follow-up in 3 months.   All questions were answered. The patient knows to call the clinic with any problems, questions or concerns. We can certainly see the patient much sooner if necessary.   Eileen Stanford,  NP

## 2023-06-03 ENCOUNTER — Other Ambulatory Visit: Payer: Self-pay | Admitting: Family

## 2023-06-03 ENCOUNTER — Telehealth: Payer: Self-pay | Admitting: *Deleted

## 2023-06-03 DIAGNOSIS — D509 Iron deficiency anemia, unspecified: Secondary | ICD-10-CM | POA: Insufficient documentation

## 2023-06-03 NOTE — Telephone Encounter (Signed)
-----   Message from Erenest Blank, NP sent at 06/03/2023  9:03 AM EDT ----- Her iron saturation is still quite low at 9%. She is currently on PO iron. We discussed IV iron at her visit this week. Would she like to proceed with IV iron to help increase her counts? If so I will place orders and we will start treatment next week.    ----- Message ----- From: Interface, Lab In [place] Sent: 06/01/2023  10:43 AM EDT To: Erenest Blank, NP

## 2023-06-03 NOTE — Telephone Encounter (Signed)
Call placed to patient to notify her per order of S. Montez Morita NP that her iron saturation is still quite low at 9% and that Maralyn Sago would like to know if she would like to proceed with IV iron to help increase her iron level. Pt states that she would like to proceed with IV iron.  Informed pt that a scheduler would give her a call to schedule iron infusion.  Pt is appreciative of call and has no questions or concerns at this time.

## 2023-06-07 NOTE — Progress Notes (Unsigned)
[facility]  PATIENT: Barbara Barnett DOB: [birth year]  REFERRING DOCTOR OR PCP:  Merwyn Katos, DPM SOURCE: Patient, notes from podiatry, notes from emergency room visit.  Laboratory and imaging results reviewed.  CT images personally reviewed.  _________________________________   HISTORICAL  CHIEF COMPLAINT:  No chief complaint on file.   HISTORY OF PRESENT ILLNESS:  Barbara Barnett is a 32 y.o. woman with relapsing remitting MS  Update 06/07/2023 She had her last Ocrevus infusion on *** and next one should be ***.  She tolerated it well.     We discussed moving next infusion but no more than 8 months (so before 11/02/2022)  The dysesthesias in the hands are doing better since going on lamotrigine.. The more severe numbness noted in September 2021 has resolved.   She notes no weakness in her legs.  Balance is slightly off and for safey she holds the bannister on stairs.   She can walk > 1 mile but not as far as before MS.   She has not noted any change in her bladder.    Vision is ok with newer glasses.     MRIs of the brain and cervical spine 01/01/2021 were consistent with MS.  Specifically, she had supratentorial and infratentorial lesions, some enhancing and  6 foci in spinal cord -- the one at T7-T8 enhancing.    Data: Imaging:  MRI brain 01/06/2022 shows multiple T2/FLAIR hyperintense foci in the hemispheres, thalamus, middle cerebral peduncles and brainstem in a pattern consistent with demyelinating plaque associated with multiple sclerosis.  1 focus in the medulla enhances after contrast.  2 other foci present on the current MRI were not present on the MRI from 01/01/2021  MR cervical 01/06/2022 T2 hyperintense foci within the spinal cord adjacent to C3 and adjacent to T1-T2.  Additional foci are present in the brainstem and cerebellum.  The spinal focus adjacent to T1-2 present on 12/2020 MRI while the focus adjacent to C3 was not present on earlier MRI   MRI of  the brain 01/01/2021 shows multiple T2/FLAIR hyperintense foci in the hemispheres and brainstem consistent with MS.  MAny of the foci in the hemispheres and one at the pontomedullary junction enhanced after contrast.  Some of the enhancing foci in the hemispheres had mass-effect.  MRI of the cervical spine 01/01/2021 showed T2 hyperintense foci at C5-C6 and T1-T2.  They do not enhance.  MRI of the thoracic spine 01/05/2021 showed hyperintense foci within the spinal cord adjacent to T1-T2, T3, T6-T7 and T7-T8.  There is subtle enhancement of the focus adjacent to T7-T8..    Laboratory: CBC 11/08/2020 was normal.  BMP 11/08/2020 shows low potassium (2.8)  REVIEW OF SYSTEMS: Constitutional: No fevers, chills, sweats, or change in appetite Eyes: No visual changes, double vision, eye pain Ear, nose and throat: No hearing loss, ear pain, nasal congestion, sore throat Cardiovascular: No chest pain, palpitations Respiratory:  No shortness of breath at rest or with exertion.   No wheezes GastrointestinaI: No nausea, vomiting, diarrhea, abdominal pain, fecal incontinence Genitourinary:  No dysuria, urinary retention or frequency.  No nocturia. Musculoskeletal:  No neck pain, back pain Integumentary: No rash, pruritus, skin lesions Neurological: as above Psychiatric: No depression at this time.  No anxiety Endocrine: No palpitations, diaphoresis, change in appetite, change in weigh or increased thirst Hematologic/Lymphatic:  No anemia, purpura, petechiae. Allergic/Immunologic: No itchy/runny eyes, nasal congestion, recent allergic reactions, rashes  ALLERGIES: No Known Allergies  HOME MEDICATIONS:  Current Outpatient Medications:  folic acid (FOLVITE) 1 MG tablet, Take 4 mg by mouth daily., Disp: , Rfl:    ibuprofen (ADVIL) 600 MG tablet, Take 1 tablet (600 mg total) by mouth every 6 (six) hours as needed., Disp: 30 tablet, Rfl: 3   iron polysaccharides (NIFEREX) 150 MG capsule, Take 150 mg by  mouth daily., Disp: , Rfl:    meloxicam (MOBIC) 15 MG tablet, Take 15 mg by mouth daily., Disp: , Rfl:    Prenatal MV & Min w/FA-DHA (PRENATAL ADULT GUMMY/DHA/FA PO), Take 2 tablets by mouth daily., Disp: , Rfl:    sertraline (ZOLOFT) 50 MG tablet, Take 50 mg by mouth daily., Disp: , Rfl:    terconazole (TERAZOL 7) 0.4 % vaginal cream, SMARTSIG:1 Vaginal Daily, Disp: , Rfl:    valACYclovir (VALTREX) 500 MG tablet, Take 1 tablet (500 mg total) by mouth daily., Disp: 90 tablet, Rfl: 3  PAST MEDICAL HISTORY: Past Medical History:  Diagnosis Date   Anxiety 26   Comes and goes on bad days i have anxiety attacks   Depression 2015   It comes and goes   HSV-2 infection    Multiple sclerosis (HCC)     PAST SURGICAL HISTORY: Past Surgical History:  Procedure Laterality Date   APPENDECTOMY     LAPAROSCOPIC APPENDECTOMY N/A 04/28/2020   Procedure: APPENDECTOMY LAPAROSCOPIC;  Surgeon: Manus Rudd, MD;  Location: [facility] OR;  Service: General;  Laterality: N/A;    FAMILY HISTORY: Family History  Problem Relation Age of Onset   Healthy Mother    Diabetes type II Father    Stroke Father    Hypertension Father    Depression Father    Diabetes Father    Diabetes Paternal Uncle    Hypertension Paternal Uncle     SOCIAL HISTORY:  Social History   Socioeconomic History   Marital status: Single    Spouse name: Not on file   Number of children: 1   Years of education: BA   Highest education level: Bachelor's degree (e.g., BA, AB, BS)  Occupational History   Occupation: Community education officer  Tobacco Use   Smoking status: Former    Types: E-cigarettes   Smokeless tobacco: Never   Tobacco comments:    1 cigar/socially (usually when she drinks)  Vaping Use   Vaping Use: Former  Substance and Sexual Activity   Alcohol use: Not Currently    Alcohol/week: 6.0 standard drinks of alcohol    Types: 6 Shots of liquor per week    Comment: 3 drinks per weekend   Drug use: Never   Sexual activity: Yes     Birth control/protection: Condom, Inserts  Other Topics Concern   Not on file  Social History Narrative   Right handed    Lives alone with her son (6 y/o)-12/18/20   Caffeine use: none   Social Determinants of Health   Financial Resource Strain: Patient Declined (06/01/2023)   Overall Financial Resource Strain (CARDIA)    Difficulty of Paying Living Expenses: Patient declined  Food Insecurity: Patient Declined (06/01/2023)   Hunger Vital Sign    Worried About Running Out of Food in the Last Year: Patient declined    Ran Out of Food in the Last Year: Patient declined  Transportation Needs: Patient Declined (06/01/2023)   PRAPARE - Administrator, Civil Service (Medical): Patient declined    Lack of Transportation (Non-Medical): Patient declined  Physical Activity: Patient Declined (06/01/2023)   Exercise Vital Sign    Days of Exercise per  Week: Patient declined    Minutes of Exercise per Session: Patient declined  Stress: Patient Declined (06/01/2023)   [employer] of Occupational Health - Occupational Stress Questionnaire    Feeling of Stress : Patient declined  Social Connections: Patient Declined (06/01/2023)   Social Connection and Isolation Panel [NHANES]    Frequency of Communication with Friends and Family: Patient declined    Frequency of Social Gatherings with Friends and Family: Patient declined    Attends Religious Services: Patient declined    Database administrator or Organizations: Patient declined    Attends Banker Meetings: Patient declined    Marital Status: Patient declined  Intimate Partner Violence: Patient Declined (06/01/2023)   Humiliation, Afraid, Rape, and Kick questionnaire    Fear of Current or Ex-Partner: Patient declined    Emotionally Abused: Patient declined    Physically Abused: Patient declined    Sexually Abused: Patient declined     PHYSICAL EXAM  There were no vitals filed for this visit.   There is no height or  weight on file to calculate BMI.   General: The patient is well-developed and well-nourished and in no acute distress  HEENT:  Head is [place]/AT.  Sclera are anicteric.    Neck:   The neck is nontender.  Skin: Extremities are without rash or  edema.  Neurologic Exam  Mental status: The patient is alert and oriented x 3 at the time of the examination. The patient has apparent normal recent and remote memory, with an apparently normal attention span and concentration ability.   Speech is normal.  Cranial nerves: Extraocular movements are full. Normal facial strength and sensation  . No obvious hearing deficits are noted.  Motor:  Muscle bulk is normal.   Tone is normal. Strength is  5 / 5 in all 4 extremities.   Sensory: Sensory testing shows normal vibration and touch sensation in arms and legs  Coordination: Cerebellar testing reveals good finger-nose-finger and slightly reduced left heel to shin    Gait and station: Station is normal.   She has a normal gait tandem gait was mildly wide.  Romberg is negative.  Reflexes: Deep tendon reflexes are symmetric and normal in arms and legs.  No ankle clonus .       DIAGNOSTIC DATA (LABS, IMAGING, TESTING) - I reviewed patient records, labs, notes, testing and imaging myself where available.  Lab Results  Component Value Date   WBC 7.0 06/01/2023   HGB 11.2 (L) 06/01/2023   HCT 36.1 06/01/2023   MCV 75.5 (L) 06/01/2023   PLT 385 06/01/2023      Component Value Date/Time   NA 140 05/19/2023 1925   NA 143 01/05/2021 1551   K 3.6 05/19/2023 1925   CL 106 05/19/2023 1925   CO2 22 05/19/2023 1925   GLUCOSE 89 05/19/2023 1925   BUN <5 (L) 05/19/2023 1925   BUN 12 01/05/2021 1551   CREATININE 0.79 05/19/2023 1925   CREATININE 0.76 12/17/2021 0000   CALCIUM 9.0 05/19/2023 1925   PROT 4.9 (L) 05/19/2023 1925   PROT 6.6 01/05/2021 1551   ALBUMIN 2.2 (L) 05/19/2023 1925   ALBUMIN 4.3 01/05/2021 1551   AST 19 05/19/2023 1925   ALT  14 05/19/2023 1925   ALKPHOS 135 (H) 05/19/2023 1925   BILITOT 0.3 05/19/2023 1925   BILITOT <0.2 01/05/2021 1551   GFRNONAA >60 05/19/2023 1925   GFRAA 114 01/05/2021 1551       ASSESSMENT AND PLAN  No diagnosis found.  1.  Continue Ocrevus.  We will check some blood work today.  Repeat MRI brain and cervical spine for surveillance monitoring.  We discussed that her Ocrevus could be moved back by up to 2 months but I would not want her to delay the infusion more than that. 2. Continue lamotrigine for dysesthesias 3.  She will return to see me in 6 months or sooner if there are new or worsening neurologic symptoms.    I spent *** minutes of face-to-face and non-face-to-face time with patient.  This included previsit chart review, lab review, study review, order entry, electronic health record documentation, patient education  Ihor Austin, [facility]  [facility] [address] [place], [place] [address]  Phone [phone] Fax [phone] Note: This document was prepared with digital dictation and possible smart phrase technology. Any transcriptional errors that result from this process are unintentional.

## 2023-06-08 ENCOUNTER — Ambulatory Visit: Payer: No Typology Code available for payment source | Admitting: Adult Health

## 2023-06-08 ENCOUNTER — Encounter: Payer: Self-pay | Admitting: Adult Health

## 2023-06-08 ENCOUNTER — Telehealth: Payer: Self-pay | Admitting: Adult Health

## 2023-06-08 ENCOUNTER — Ambulatory Visit (INDEPENDENT_AMBULATORY_CARE_PROVIDER_SITE_OTHER): Payer: No Typology Code available for payment source | Admitting: Adult Health

## 2023-06-08 VITALS — BP 120/73 | HR 108 | Ht 60.0 in | Wt 126.6 lb

## 2023-06-08 DIAGNOSIS — G35 Multiple sclerosis: Secondary | ICD-10-CM

## 2023-06-08 NOTE — Telephone Encounter (Signed)
sent to [facility] they obtain [employer] auth [id number] 

## 2023-06-09 ENCOUNTER — Telehealth (HOSPITAL_COMMUNITY): Payer: Self-pay

## 2023-06-09 NOTE — Telephone Encounter (Signed)
06/09/2023 1422  Name: Barbara Barnett MRN: [id number] DOB: 1991-08-28  Reason for Call:  Transition of Care Hospital Discharge Call  Contact Status: Patient Contact Status: Complete  Language assistant needed: Interpreter Mode: Interpreter Not Needed        Follow-Up Questions: Do You Have Any Concerns About Your Health As You Heal From Delivery?: No Do You Have Any Concerns About Your Infants Health?: No  Edinburgh Postnatal Depression Scale:  In the Past 7 Days: I have been able to laugh and see the funny side of things.: As much as I always could I have looked forward with enjoyment to things.: As much as I ever did I have blamed myself unnecessarily when things went wrong.: Not very often I have been anxious or worried for no good reason.: Yes, sometimes I have felt scared or panicky for no good reason.: No, not at all Things have been getting on top of me.: No, most of the time I have coped quite well I have been so unhappy that I have had difficulty sleeping.: Not very often I have felt sad or miserable.: Not very often I have been so unhappy that I have been crying.: No, never The thought of harming myself has occurred to me.: Never Edinburgh Postnatal Depression Scale Total: 6  PHQ2-9 Depression Scale:     Discharge Follow-up: Edinburgh score requires follow up?: No Patient was advised of the following resources:: Breastfeeding Support Group, Support Group Did patient express any COVID concerns?: No  Post-discharge interventions: Reviewed Newborn Safe Sleep Practices  Signature  Signe Colt

## 2023-06-10 ENCOUNTER — Inpatient Hospital Stay: Payer: No Typology Code available for payment source

## 2023-06-10 VITALS — BP 128/80 | HR 78 | Temp 99.4°F | Resp 16

## 2023-06-10 DIAGNOSIS — D509 Iron deficiency anemia, unspecified: Secondary | ICD-10-CM | POA: Diagnosis not present

## 2023-06-10 MED ORDER — SODIUM CHLORIDE 0.9% FLUSH: 10.0000 mL | Freq: Once | INTRAVENOUS | Status: DC | PRN

## 2023-06-10 MED ORDER — SODIUM CHLORIDE 0.9 % IV SOLN
300.0000 mg | Freq: Once | INTRAVENOUS | Status: AC
  Administered 2023-06-10: 300 mg via INTRAVENOUS
  Filled 2023-06-10: qty 300

## 2023-06-10 MED ORDER — SODIUM CHLORIDE 0.9 % IV SOLN: Freq: Once | INTRAVENOUS | Status: AC

## 2023-06-10 MED ORDER — SODIUM CHLORIDE 0.9% FLUSH: 3.0000 mL | Freq: Once | INTRAVENOUS | Status: DC | PRN

## 2023-06-10 NOTE — Patient Instructions (Signed)

## 2023-06-10 NOTE — Progress Notes (Signed)
Before discharge gently reminded patient children under the age of 39 are not allowed in the infusion room per [facility] policy. Discussed with patient options for child care so she can receive her iron infusions going forward. Patient stated with an aggressive tone: "I will cancel my appointments since y'all have a problem with him being here!"  Patient was upset, took the blood pressure cuff off, pushed the IV pole to the side and walked out of the [facility].

## 2023-06-13 ENCOUNTER — Telehealth: Payer: Self-pay | Admitting: Family

## 2023-06-13 NOTE — Telephone Encounter (Signed)
Patient stopped by my desk on her way out from infusion on 06/10/2023. stated she did not want to come to any future appointments and wanted to cancel all appointments due to the fact she could not bring her newborn child with her.

## 2023-06-17 ENCOUNTER — Ambulatory Visit: Payer: No Typology Code available for payment source

## 2023-06-22 ENCOUNTER — Encounter: Payer: Self-pay | Admitting: Adult Health

## 2023-06-23 ENCOUNTER — Encounter: Payer: Self-pay | Admitting: Adult Health

## 2023-06-23 ENCOUNTER — Other Ambulatory Visit: Payer: Self-pay | Admitting: Medical-Surgical

## 2023-06-23 ENCOUNTER — Other Ambulatory Visit: Payer: Self-pay | Admitting: Sports Medicine

## 2023-06-23 DIAGNOSIS — M7542 Impingement syndrome of left shoulder: Secondary | ICD-10-CM

## 2023-06-25 ENCOUNTER — Ambulatory Visit
Admission: RE | Admit: 2023-06-25 | Discharge: 2023-06-25 | Disposition: A | Discharge status: Home / Self Care | Payer: No Typology Code available for payment source | Source: Ambulatory Visit | Attending: Adult Health | Admitting: Adult Health

## 2023-06-25 DIAGNOSIS — G35 Multiple sclerosis: Secondary | ICD-10-CM

## 2023-06-25 MED ORDER — GADOPICLENOL 0.5 MMOL/ML IV SOLN
6.0000 mL | Freq: Once | INTRAVENOUS | Status: AC | PRN
  Administered 2023-06-25: 6 mL via INTRAVENOUS

## 2023-11-09 ENCOUNTER — Ambulatory Visit: Payer: No Typology Code available for payment source | Admitting: Neurology

## 2023-11-09 ENCOUNTER — Encounter: Payer: Self-pay | Admitting: Neurology

## 2023-11-09 VITALS — BP 128/92 | HR 86 | Ht 60.0 in | Wt 127.0 lb

## 2023-11-09 DIAGNOSIS — R200 Anesthesia of skin: Secondary | ICD-10-CM

## 2023-11-09 DIAGNOSIS — Z79899 Other long term (current) drug therapy: Secondary | ICD-10-CM

## 2023-11-09 DIAGNOSIS — G35 Multiple sclerosis: Secondary | ICD-10-CM

## 2023-11-09 DIAGNOSIS — R269 Unspecified abnormalities of gait and mobility: Secondary | ICD-10-CM | POA: Diagnosis not present

## 2023-11-09 DIAGNOSIS — G35D Multiple sclerosis, unspecified: Secondary | ICD-10-CM

## 2023-11-09 NOTE — Progress Notes (Addendum)
[facility]  PATIENT: Barbara Barnett DOB: [birth year]  REFERRING DOCTOR OR PCP:  Merwyn Katos, DPM SOURCE: Patient, notes from podiatry, notes from emergency room visit.  Laboratory and imaging results reviewed.  CT images personally reviewed.  _________________________________   HISTORICAL  CHIEF COMPLAINT:  Chief Complaint  Patient presents with   Follow-up    RM 10, alone. Last seen 06/08/23. Off DMT for MS. No concerns, no new sx.    HISTORY OF PRESENT ILLNESS:  Barbara Barnett is a 32 y.o. woman with relapsing remitting MS  Update 11/09/2023 She had her last Ocrevus infusion on 09/02/2022 and she stopped after pregnancy and delivery (boy) May 18, 2023.   She breast-fed for a few months and is now ready to get back on a DMT.  She tolerated Ocrevus well.   While on it she had no exacerbation    MRI 05/2023 showed no progression during her pregnancy    She is walking well and keeps up with others.  Occasionally,  she feels her legs are mildly weak.  She has no trouble with stairs but had one fall with a loose shoe.   She can use stairs without the bannister.  Bladder function is fine with glasses.   Bladder function is fine.    The dysesthesias in the hands and feet has mostly resolved . The more severe numbness noted in September 2021 has resolved.     Data: Imaging: MRI brain 06/25/2023 showed Multiple supratentorial and infratentorial foci in a pattern consistent with chronic demyelinating plaque associated with multiple sclerosis. None of the foci enhanced or appear to be acute. Compared to the MRI from 01/06/2022, there were no new lesions.   MRI cervical spine showed multiple T2 hyperintense foci. These are located posterocentrally adjacent to C3, posterior centrally adjacent to T1-T2. None of the foci enhance after contrast. Both of these foci were present on the previous 01/06/2022 MRI and there are no new lesions noted.   MRI of the brain 01/01/2021 shows  multiple T2/FLAIR hyperintense foci in the hemispheres and brainstem consistent with MS.  MAny of the foci in the hemispheres and one at the pontomedullary junction enhanced after contrast.  Some of the enhancing foci in the hemispheres had mass-effect.  MRI of the cervical spine 01/01/2021 showed T2 hyperintense foci at C5-C6 and T1-T2.  They do not enhance.  MRI of the thoracic spine 01/05/2021 showed hyperintense foci within the spinal cord adjacent to T1-T2, T3, T6-T7 and T7-T8.  There is subtle enhancement of the focus adjacent to T7-T8..    Laboratory: CBC 11/08/2020 was normal.  BMP 11/08/2020 shows low potassium (2.8)  REVIEW OF SYSTEMS: Constitutional: No fevers, chills, sweats, or change in appetite Eyes: No visual changes, double vision, eye pain Ear, nose and throat: No hearing loss, ear pain, nasal congestion, sore throat Cardiovascular: No chest pain, palpitations Respiratory:  No shortness of breath at rest or with exertion.   No wheezes GastrointestinaI: No nausea, vomiting, diarrhea, abdominal pain, fecal incontinence Genitourinary:  No dysuria, urinary retention or frequency.  No nocturia. Musculoskeletal:  No neck pain, back pain Integumentary: No rash, pruritus, skin lesions Neurological: as above Psychiatric: No depression at this time.  No anxiety Endocrine: No palpitations, diaphoresis, change in appetite, change in weigh or increased thirst Hematologic/Lymphatic:  No anemia, purpura, petechiae. Allergic/Immunologic: No itchy/runny eyes, nasal congestion, recent allergic reactions, rashes  ALLERGIES: No Known Allergies  HOME MEDICATIONS:  Current Outpatient Medications:    folic acid (FOLVITE)  1 MG tablet, Take 4 mg by mouth daily., Disp: , Rfl:    ibuprofen (ADVIL) 600 MG tablet, Take 1 tablet (600 mg total) by mouth every 6 (six) hours as needed., Disp: 30 tablet, Rfl: 3   iron polysaccharides (NIFEREX) 150 MG capsule, Take 150 mg by mouth daily., Disp: , Rfl:     meloxicam (MOBIC) 15 MG tablet, Take 15 mg by mouth daily., Disp: , Rfl:    Prenatal MV & Min w/FA-DHA (PRENATAL ADULT GUMMY/DHA/FA PO), Take 2 tablets by mouth daily., Disp: , Rfl:    sertraline (ZOLOFT) 50 MG tablet, Take 1 tablet (50 mg total) by mouth daily. NEEDS APPOINTMENT FOR FURTHER REFILLS., Disp: 90 tablet, Rfl: 0   terconazole (TERAZOL 7) 0.4 % vaginal cream, SMARTSIG:1 Vaginal Daily, Disp: , Rfl:    valACYclovir (VALTREX) 500 MG tablet, Take 1 tablet (500 mg total) by mouth daily., Disp: 90 tablet, Rfl: 3  PAST MEDICAL HISTORY: Past Medical History:  Diagnosis Date   Anxiety 23   Comes and goes on bad days i have anxiety attacks   Depression 2015   It comes and goes   HSV-2 infection    Multiple sclerosis (HCC)     PAST SURGICAL HISTORY: Past Surgical History:  Procedure Laterality Date   APPENDECTOMY     LAPAROSCOPIC APPENDECTOMY N/A 04/28/2020   Procedure: APPENDECTOMY LAPAROSCOPIC;  Surgeon: Manus Rudd, MD;  Location: [facility] OR;  Service: General;  Laterality: N/A;    FAMILY HISTORY: Family History  Problem Relation Age of Onset   Healthy Mother    Diabetes type II Father    Stroke Father    Hypertension Father    Depression Father    Diabetes Father    Diabetes Paternal Uncle    Hypertension Paternal Uncle     SOCIAL HISTORY:  Social History   Socioeconomic History   Marital status: Single    Spouse name: Not on file   Number of children: 1   Years of education: BA   Highest education level: Bachelor's degree (e.g., BA, AB, BS)  Occupational History   Occupation: Community education officer  Tobacco Use   Smoking status: Former    Types: E-cigarettes   Smokeless tobacco: Never   Tobacco comments:    1 cigar/socially (usually when she drinks)  Vaping Use   Vaping status: Former  Substance and Sexual Activity   Alcohol use: Not Currently    Alcohol/week: 6.0 standard drinks of alcohol    Types: 6 Shots of liquor per week    Comment: 3 drinks per weekend    Drug use: Never   Sexual activity: Yes    Birth control/protection: Condom, Inserts  Other Topics Concern   Not on file  Social History Narrative   Right handed    Lives alone with her son (6 y/o)-12/18/20   Caffeine use: none   Social Determinants of Health   Financial Resource Strain: Patient Declined (06/01/2023)   Overall Financial Resource Strain (CARDIA)    Difficulty of Paying Living Expenses: Patient declined  Food Insecurity: Patient Declined (06/01/2023)   Hunger Vital Sign    Worried About Running Out of Food in the Last Year: Patient declined    Ran Out of Food in the Last Year: Patient declined  Transportation Needs: Patient Declined (06/01/2023)   PRAPARE - Administrator, Civil Service (Medical): Patient declined    Lack of Transportation (Non-Medical): Patient declined  Physical Activity: Patient Declined (06/01/2023)   Exercise Vital Sign  Days of Exercise per Week: Patient declined    Minutes of Exercise per Session: Patient declined  Stress: Patient Declined (06/01/2023)   [employer] of Occupational Health - Occupational Stress Questionnaire    Feeling of Stress : Patient declined  Social Connections: Patient Declined (06/01/2023)   Social Connection and Isolation Panel [NHANES]    Frequency of Communication with Friends and Family: Patient declined    Frequency of Social Gatherings with Friends and Family: Patient declined    Attends Religious Services: Patient declined    Database administrator or Organizations: Patient declined    Attends Banker Meetings: Patient declined    Marital Status: Patient declined  Intimate Partner Violence: Patient Declined (06/01/2023)   Humiliation, Afraid, Rape, and Kick questionnaire    Fear of Current or Ex-Partner: Patient declined    Emotionally Abused: Patient declined    Physically Abused: Patient declined    Sexually Abused: Patient declined     PHYSICAL EXAM  Vitals:   11/09/23 0809  BP:  (!) 128/92  Pulse: 86  Weight: 127 lb (57.6 kg)  Height: 5' (1.524 m)    Body mass index is 24.8 kg/m.   General: The patient is well-developed and well-nourished and in no acute distress  HEENT:  Head is [place]/AT.  Sclera are anicteric.    Neck:   The neck is nontender.  Skin: Extremities are without rash or  edema.  Neurologic Exam  Mental status: The patient is alert and oriented x 3 at the time of the examination. The patient has apparent normal recent and remote memory, with an apparently normal attention span and concentration ability.   Speech is normal.  Cranial nerves: Extraocular movements are full. Normal facial strength and sensation  . No obvious hearing deficits are noted.  Motor:  Muscle bulk is normal.   Tone is normal. Strength is  5 / 5 in all 4 extremities.   Sensory: Sensory testing shows normal vibration and touch sensation in arms and legs  Coordination: Cerebellar testing reveals good finger-nose-finger and slightly reduced left heel to shin    Gait and station: Station is normal.   She has a normal gait .  The tandem gait was wide.  Romberg is negative.  Reflexes: Deep tendon reflexes are symmetric and normal in arms and legs.  No crossed adductors or ankle clonus .      DIAGNOSTIC DATA (LABS, IMAGING, TESTING) - I reviewed patient records, labs, notes, testing and imaging myself where available.  Lab Results  Component Value Date   WBC 7.0 06/01/2023   HGB 11.2 (L) 06/01/2023   HCT 36.1 06/01/2023   MCV 75.5 (L) 06/01/2023   PLT 385 06/01/2023      Component Value Date/Time   NA 140 05/19/2023 1925   NA 143 01/05/2021 1551   K 3.6 05/19/2023 1925   CL 106 05/19/2023 1925   CO2 22 05/19/2023 1925   GLUCOSE 89 05/19/2023 1925   BUN <5 (L) 05/19/2023 1925   BUN 12 01/05/2021 1551   CREATININE 0.79 05/19/2023 1925   CREATININE 0.76 12/17/2021 0000   CALCIUM 9.0 05/19/2023 1925   PROT 4.9 (L) 05/19/2023 1925   PROT 6.6 01/05/2021 1551    ALBUMIN 2.2 (L) 05/19/2023 1925   ALBUMIN 4.3 01/05/2021 1551   AST 19 05/19/2023 1925   ALT 14 05/19/2023 1925   ALKPHOS 135 (H) 05/19/2023 1925   BILITOT 0.3 05/19/2023 1925   BILITOT <0.2 01/05/2021 1551  GFRNONAA >60 05/19/2023 1925   GFRAA 114 01/05/2021 1551       ASSESSMENT AND PLAN  Multiple sclerosis (HCC)  High risk medication use  Gait disturbance  Numbness  1.  She will get back on Ocrevus.  We will check some blood work today She is no longer breastfeeding.  2. Consider lamotrigine if  dysesthesias worsen 3.  She will return to see me in 6 months or sooner if there are new or worsening neurologic symptoms.    Buena Boehm A. Epimenio Foot, MD, [facility] 11/09/2023, 8:43 AM Certified in Neurology, Clinical Neurophysiology, Sleep Medicine and Neuroimaging  [facility] [address], [address] [place], [place] [address] [phone]

## 2023-11-12 LAB — HEPATITIS B SURFACE ANTIGEN: Hepatitis B Surface Ag: NEGATIVE

## 2023-11-12 LAB — CBC WITH DIFFERENTIAL/PLATELET
Basophils Absolute: 0 10*3/uL (ref 0.0–0.2)
Basos: 0 %
EOS (ABSOLUTE): 0.1 10*3/uL (ref 0.0–0.4)
Eos: 1 %
Hematocrit: 37.4 % (ref 34.0–46.6)
Hemoglobin: 12.4 g/dL (ref 11.1–15.9)
Immature Grans (Abs): 0 10*3/uL (ref 0.0–0.1)
Immature Granulocytes: 0 %
Lymphocytes Absolute: 0.9 10*3/uL (ref 0.7–3.1)
Lymphs: 15 %
MCH: 31.1 pg (ref 26.6–33.0)
MCHC: 33.2 g/dL (ref 31.5–35.7)
MCV: 94 fL (ref 79–97)
Monocytes Absolute: 0.6 10*3/uL (ref 0.1–0.9)
Monocytes: 10 %
Neutrophils Absolute: 4.5 10*3/uL (ref 1.4–7.0)
Neutrophils: 74 %
Platelets: 297 10*3/uL (ref 150–450)
RBC: 3.99 x10E6/uL (ref 3.77–5.28)
RDW: 12.3 % (ref 11.7–15.4)
WBC: 6.1 10*3/uL (ref 3.4–10.8)

## 2023-11-12 LAB — IGG, IGA, IGM
IgA/Immunoglobulin A, Serum: 168 mg/dL (ref 87–352)
IgG (Immunoglobin G), Serum: 464 mg/dL — ABNORMAL LOW (ref 586–1602)
IgM (Immunoglobulin M), Srm: 143 mg/dL (ref 26–217)

## 2023-11-12 LAB — HEPATITIS B CORE ANTIBODY, TOTAL: Hep B Core Total Ab: NEGATIVE

## 2023-11-12 LAB — QUANTIFERON-TB GOLD PLUS
QuantiFERON Nil Value: 0 [IU]/mL
QuantiFERON TB1 Ag Value: 0.01 [IU]/mL
QuantiFERON TB2 Ag Value: 0 [IU]/mL

## 2023-11-12 LAB — HEPATITIS B SURFACE ANTIBODY,QUALITATIVE: Hep B Surface Ab, Qual: REACTIVE

## 2023-11-15 ENCOUNTER — Telehealth: Payer: Self-pay | Admitting: *Deleted

## 2023-11-15 NOTE — Telephone Encounter (Signed)
Patient informed with labs results. Start form faxed to [facility] [phone]

## 2023-11-15 NOTE — Telephone Encounter (Signed)
-----   Message from Asa Lente sent at 11/14/2023 12:47 PM EST ----- Please let her know that most of the labs look good.  We can send in the form for Ocrevus.   The IgG was a little bit low and we will keep an eye on that to see if we need to adjust the Ocrevus dose.

## 2023-11-15 NOTE — Telephone Encounter (Signed)
  Order given to [facility] in [employer]

## 2023-12-14 ENCOUNTER — Other Ambulatory Visit: Payer: Self-pay | Admitting: Medical-Surgical

## 2024-01-17 ENCOUNTER — Other Ambulatory Visit: Payer: Self-pay | Admitting: Medical-Surgical

## 2024-02-01 ENCOUNTER — Other Ambulatory Visit: Payer: Self-pay | Admitting: Medical-Surgical

## 2024-02-09 NOTE — Telephone Encounter (Signed)
 Spoke w/ intrafusion/[facility]. Pt had infusion 02/01/24 and 02/15/24 for loading dose. Next infusion for maintenance dose scheduled for 08/01/24

## 2024-03-02 ENCOUNTER — Other Ambulatory Visit: Payer: Self-pay | Admitting: Medical-Surgical

## 2024-05-22 ENCOUNTER — Telehealth: Payer: Self-pay | Admitting: Neurology

## 2024-05-22 NOTE — Telephone Encounter (Signed)
 Appointment details confirmed

## 2024-05-23 ENCOUNTER — Encounter: Payer: Self-pay | Admitting: Neurology

## 2024-05-23 ENCOUNTER — Ambulatory Visit (INDEPENDENT_AMBULATORY_CARE_PROVIDER_SITE_OTHER): Payer: No Typology Code available for payment source | Admitting: Neurology

## 2024-05-23 ENCOUNTER — Telehealth: Payer: Self-pay | Admitting: Pharmacist

## 2024-05-23 VITALS — BP 136/81 | HR 99 | Ht 61.0 in | Wt 128.0 lb

## 2024-05-23 DIAGNOSIS — F988 Other specified behavioral and emotional disorders with onset usually occurring in childhood and adolescence: Secondary | ICD-10-CM

## 2024-05-23 DIAGNOSIS — Z79899 Other long term (current) drug therapy: Secondary | ICD-10-CM | POA: Diagnosis not present

## 2024-05-23 DIAGNOSIS — G35 Multiple sclerosis: Secondary | ICD-10-CM | POA: Diagnosis not present

## 2024-05-23 DIAGNOSIS — R5383 Other fatigue: Secondary | ICD-10-CM

## 2024-05-23 DIAGNOSIS — R208 Other disturbances of skin sensation: Secondary | ICD-10-CM

## 2024-05-23 DIAGNOSIS — R269 Unspecified abnormalities of gait and mobility: Secondary | ICD-10-CM | POA: Diagnosis not present

## 2024-05-23 MED ORDER — MODAFINIL 200 MG PO TABS: 200.0000 mg | ORAL_TABLET | Freq: Every day | ORAL | 5 refills | Status: AC

## 2024-05-23 NOTE — Telephone Encounter (Signed)
 Pharmacy Patient Advocate Encounter  Received notification from [facility] that Prior Authorization for Modafinil 200MG  tablets has been APPROVED from 06.25.25 to 06.25.26   PA #/Case ID/Reference #: [id number]

## 2024-05-23 NOTE — Progress Notes (Signed)
 [facility]  PATIENT: Barbara Barnett DOB: 1991/06/14  REFERRING DOCTOR OR PCP:  Norleen Binder, DPM SOURCE: Patient, notes from podiatry, notes from emergency room visit.  Laboratory and imaging results reviewed.  CT images personally reviewed.  _________________________________   HISTORICAL  CHIEF COMPLAINT:  Chief Complaint  Patient presents with   Follow-up    Pt in room 10. Alone.Here for MS follow up. DMT: Ocrevus  ,Next infusion date: 07/31/24. Pt reports some back pain, pt has baby last year. Last eye exam was last year.     HISTORY OF PRESENT ILLNESS:  Barbara Barnett is a 33 y.o. woman with relapsing remitting MS  Update 05/23/2024 She is on Ocrevus  and next infusion is Septembr 2025 .  She had stopped after pregnancy and delivery (boy) May 18, 2023.     She tolerated Ocrevus  well.   While on it she had no exacerbation    MRI 05/2023 showed no progression during her pregnancy    She is walking well and keeps up with others.   She has no trouble with stairs .  No recent fall.    Dysesthesias resolved.  Bladder function is fine.  Viion is good (wears glasses).   .    She feels more forgetful and sometimes has word finding issues.    She was never diagnosed ith ADD.   However she feels focus and attention are reduced.   She has a lot of thoughts at night and sometimes has trouble falling asleep.   She denies depression  Data: Imaging: MRI brain 06/25/2023 showed Multiple supratentorial and infratentorial foci in a pattern consistent with chronic demyelinating plaque associated with multiple sclerosis. None of the foci enhanced or appear to be acute. Compared to the MRI from 01/06/2022, there were no new lesions.   MRI cervical spine showed multiple T2 hyperintense foci. These are located posterocentrally adjacent to C3, posterior centrally adjacent to T1-T2. None of the foci enhance after contrast. Both of these foci were present on the previous 01/06/2022 MRI and  there are no new lesions noted.   MRI of the brain 01/01/2021 shows multiple T2/FLAIR hyperintense foci in the hemispheres and brainstem consistent with MS.  MAny of the foci in the hemispheres and one at the pontomedullary junction enhanced after contrast.  Some of the enhancing foci in the hemispheres had mass-effect.  MRI of the cervical spine 01/01/2021 showed T2 hyperintense foci at C5-C6 and T1-T2.  They do not enhance.  MRI of the thoracic spine 01/05/2021 showed hyperintense foci within the spinal cord adjacent to T1-T2, T3, T6-T7 and T7-T8.  There is subtle enhancement of the focus adjacent to T7-T8..    Laboratory: CBC 11/08/2020 was normal.  BMP 11/08/2020 shows low potassium (2.8)  REVIEW OF SYSTEMS: Constitutional: No fevers, chills, sweats, or change in appetite Eyes: No visual changes, double vision, eye pain Ear, nose and throat: No hearing loss, ear pain, nasal congestion, sore throat Cardiovascular: No chest pain, palpitations Respiratory:  No shortness of breath at rest or with exertion.   No wheezes GastrointestinaI: No nausea, vomiting, diarrhea, abdominal pain, fecal incontinence Genitourinary:  No dysuria, urinary retention or frequency.  No nocturia. Musculoskeletal:  No neck pain, back pain Integumentary: No rash, pruritus, skin lesions Neurological: as above Psychiatric: No depression at this time.  No anxiety Endocrine: No palpitations, diaphoresis, change in appetite, change in weigh or increased thirst Hematologic/Lymphatic:  No anemia, purpura, petechiae. Allergic/Immunologic: No itchy/runny eyes, nasal congestion, recent allergic reactions, rashes  ALLERGIES: No Known Allergies  HOME MEDICATIONS:  Current Outpatient Medications:    ibuprofen  (ADVIL ) 600 MG tablet, Take 1 tablet (600 mg total) by mouth every 6 (six) hours as needed., Disp: 30 tablet, Rfl: 3   sertraline  (ZOLOFT ) 50 MG tablet, TAKE 1 TABLET (50 MG TOTAL) BY MOUTH DAILY. NEEDS APPOINTMENT FOR  FURTHER REFILLS., Disp: 7 tablet, Rfl: 0   folic acid (FOLVITE) 1 MG tablet, Take 4 mg by mouth daily. (Patient not taking: Reported on 05/23/2024), Disp: , Rfl:    iron  polysaccharides (NIFEREX) 150 MG capsule, Take 150 mg by mouth daily. (Patient not taking: Reported on 05/23/2024), Disp: , Rfl:    meloxicam  (MOBIC ) 15 MG tablet, Take 15 mg by mouth daily. (Patient not taking: Reported on 05/23/2024), Disp: , Rfl:    Prenatal MV & Min w/FA-DHA (PRENATAL ADULT GUMMY/DHA/FA PO), Take 2 tablets by mouth daily. (Patient not taking: Reported on 05/23/2024), Disp: , Rfl:    terconazole (TERAZOL 7) 0.4 % vaginal cream, SMARTSIG:1 Vaginal Daily (Patient not taking: Reported on 05/23/2024), Disp: , Rfl:   PAST MEDICAL HISTORY: Past Medical History:  Diagnosis Date   Anxiety 57   Comes and goes on bad days i have anxiety attacks   Depression 2015   It comes and goes   HSV-2 infection    Multiple sclerosis (HCC)     PAST SURGICAL HISTORY: Past Surgical History:  Procedure Laterality Date   APPENDECTOMY     LAPAROSCOPIC APPENDECTOMY N/A 04/28/2020   Procedure: APPENDECTOMY LAPAROSCOPIC;  Surgeon: Belinda Cough, MD;  Location: [facility] OR;  Service: General;  Laterality: N/A;    FAMILY HISTORY: Family History  Problem Relation Age of Onset   Healthy Mother    Diabetes type II Father    Stroke Father    Hypertension Father    Depression Father    Diabetes Father    Diabetes Paternal Uncle    Hypertension Paternal Uncle     SOCIAL HISTORY:  Social History   Socioeconomic History   Marital status: Single    Spouse name: Not on file   Number of children: 1   Years of education: BA   Highest education level: Bachelor's degree (e.g., BA, AB, BS)  Occupational History   Occupation: Community education officer  Tobacco Use   Smoking status: Former    Types: E-cigarettes   Smokeless tobacco: Never   Tobacco comments:    1 cigar/socially (usually when she drinks)  Vaping Use   Vaping status: Former   Substance and Sexual Activity   Alcohol use: Not Currently    Alcohol/week: 6.0 standard drinks of alcohol    Types: 6 Shots of liquor per week    Comment: 3 drinks per weekend   Drug use: Never   Sexual activity: Yes    Birth control/protection: Condom, Inserts  Other Topics Concern   Not on file  Social History Narrative   Right handed    Lives alone with her son (6 y/o)-12/18/20   Caffeine use: none   Social Drivers of Corporate investment banker Strain: Patient Declined (06/01/2023)   Overall Financial Resource Strain (CARDIA)    Difficulty of Paying Living Expenses: Patient declined  Food Insecurity: Patient Declined (06/01/2023)   Hunger Vital Sign    Worried About Running Out of Food in the Last Year: Patient declined    Ran Out of Food in the Last Year: Patient declined  Transportation Needs: Patient Declined (06/01/2023)   PRAPARE - Transportation    Lack  of Transportation (Medical): Patient declined    Lack of Transportation (Non-Medical): Patient declined  Physical Activity: Patient Declined (06/01/2023)   Exercise Vital Sign    Days of Exercise per Week: Patient declined    Minutes of Exercise per Session: Patient declined  Stress: Patient Declined (06/01/2023)   [employer] of Occupational Health - Occupational Stress Questionnaire    Feeling of Stress : Patient declined  Social Connections: Patient Declined (06/01/2023)   Social Connection and Isolation Panel    Frequency of Communication with Friends and Family: Patient declined    Frequency of Social Gatherings with Friends and Family: Patient declined    Attends Religious Services: Patient declined    Database administrator or Organizations: Patient declined    Attends Banker Meetings: Patient declined    Marital Status: Patient declined  Intimate Partner Violence: Patient Declined (06/01/2023)   Humiliation, Afraid, Rape, and Kick questionnaire    Fear of Current or Ex-Partner: Patient declined     Emotionally Abused: Patient declined    Physically Abused: Patient declined    Sexually Abused: Patient declined     PHYSICAL EXAM  Vitals:   05/23/24 1136  BP: 136/81  Pulse: 99  Weight: 128 lb (58.1 kg)  Height: 5' 1 (1.549 m)    Body mass index is 24.19 kg/m.   General: The patient is well-developed and well-nourished and in no acute distress  HEENT:  Head is [place]/AT.  Sclera are anicteric.    Neck:   The neck is nontender.  Skin: Extremities are without rash or  edema.  Neurologic Exam  Mental status: The patient is alert and oriented x 3 at the time of the examination. The patient has apparent normal recent and remote memory, with an apparently normal attention span and concentration ability.   Speech is normal.  Cranial nerves: Extraocular movements are full. Normal facial strength and sensation  . No obvious hearing deficits are noted.  Motor:  Muscle bulk is normal.   Tone is normal. Strength is  5 / 5 in all 4 extremities.   Sensory: Sensory testing shows normal vibration and touch sensation in arms and legs  Coordination: Cerebellar testing reveals good finger-nose-finger and heel to shin    Gait and station: Station is normal.   She has a normal gait .  The tandem gait was wide.  Romberg is negative.  Reflexes: Deep tendon reflexes are symmetric and normal in arms and legs.   No ankle clonus     DIAGNOSTIC DATA (LABS, IMAGING, TESTING) - I reviewed patient records, labs, notes, testing and imaging myself where available.  Lab Results  Component Value Date   WBC 6.1 11/09/2023   HGB 12.4 11/09/2023   HCT 37.4 11/09/2023   MCV 94 11/09/2023   PLT 297 11/09/2023      Component Value Date/Time   NA 140 05/19/2023 1925   NA 143 01/05/2021 1551   K 3.6 05/19/2023 1925   CL 106 05/19/2023 1925   CO2 22 05/19/2023 1925   GLUCOSE 89 05/19/2023 1925   BUN <5 (L) 05/19/2023 1925   BUN 12 01/05/2021 1551   CREATININE 0.79 05/19/2023 1925    CREATININE 0.76 12/17/2021 0000   CALCIUM  9.0 05/19/2023 1925   PROT 4.9 (L) 05/19/2023 1925   PROT 6.6 01/05/2021 1551   ALBUMIN  2.2 (L) 05/19/2023 1925   ALBUMIN  4.3 01/05/2021 1551   AST 19 05/19/2023 1925   ALT 14 05/19/2023 1925   ALKPHOS  135 (H) 05/19/2023 1925   BILITOT 0.3 05/19/2023 1925   BILITOT <0.2 01/05/2021 1551   GFRNONAA >60 05/19/2023 1925   GFRAA 114 01/05/2021 1551       ASSESSMENT AND PLAN  Multiple sclerosis (HCC)  High risk medication use  Dysesthesia  Gait disturbance  Attention deficit disorder, unspecified type  Other fatigue   1.  She will get back on Ocrevus .  We will check some blood work today She is no longer breastfeeding.    Consider recheck MRI around time of next visit 2.   Trial of modafinil for fatigue.   If no improvement, consider a stimulant for fatigue and attention 3.  She will return to see me in 6 months or sooner if there are new or worsening neurologic symptoms.    Armandina Iman A. Vear, MD, [facility] 05/23/2024, 12:10 PM Certified in Neurology, Clinical Neurophysiology, Sleep Medicine and Neuroimaging  [facility] [address], [address] [place], [place] [address] [phone]

## 2024-05-24 ENCOUNTER — Ambulatory Visit: Payer: Self-pay | Admitting: Neurology

## 2024-05-24 LAB — CBC WITH DIFFERENTIAL/PLATELET
Basophils Absolute: 0 10*3/uL (ref 0.0–0.2)
Basos: 0 %
EOS (ABSOLUTE): 0 10*3/uL (ref 0.0–0.4)
Eos: 0 %
Hematocrit: 41.5 % (ref 34.0–46.6)
Hemoglobin: 13.8 g/dL (ref 11.1–15.9)
Immature Grans (Abs): 0 10*3/uL (ref 0.0–0.1)
Immature Granulocytes: 0 %
Lymphocytes Absolute: 1 10*3/uL (ref 0.7–3.1)
Lymphs: 11 %
MCH: 30.4 pg (ref 26.6–33.0)
MCHC: 33.3 g/dL (ref 31.5–35.7)
MCV: 91 fL (ref 79–97)
Monocytes Absolute: 0.7 10*3/uL (ref 0.1–0.9)
Monocytes: 8 %
Neutrophils Absolute: 7.2 10*3/uL — ABNORMAL HIGH (ref 1.4–7.0)
Neutrophils: 81 %
Platelets: 293 10*3/uL (ref 150–450)
RBC: 4.54 x10E6/uL (ref 3.77–5.28)
RDW: 13.2 % (ref 11.7–15.4)
WBC: 9 10*3/uL (ref 3.4–10.8)

## 2024-05-24 LAB — IGG, IGA, IGM
IgA/Immunoglobulin A, Serum: 185 mg/dL (ref 87–352)
IgG (Immunoglobin G), Serum: 568 mg/dL — ABNORMAL LOW (ref 586–1602)
IgM (Immunoglobulin M), Srm: 159 mg/dL (ref 26–217)

## 2024-06-13 ENCOUNTER — Ambulatory Visit (INDEPENDENT_AMBULATORY_CARE_PROVIDER_SITE_OTHER): Admitting: Medical-Surgical

## 2024-06-13 ENCOUNTER — Encounter: Payer: Self-pay | Admitting: Medical-Surgical

## 2024-06-13 VITALS — BP 123/79 | HR 78 | Resp 20 | Wt 127.0 lb

## 2024-06-13 DIAGNOSIS — Z0000 Encounter for general adult medical examination without abnormal findings: Secondary | ICD-10-CM | POA: Diagnosis not present

## 2024-06-13 DIAGNOSIS — F418 Other specified anxiety disorders: Secondary | ICD-10-CM

## 2024-06-13 MED ORDER — ESCITALOPRAM OXALATE 5 MG PO TABS: 5.0000 mg | ORAL_TABLET | Freq: Every day | ORAL | 3 refills | Status: DC

## 2024-06-13 NOTE — Progress Notes (Signed)
 Complete physical exam  Patient: Barbara Barnett   DOB: [birth year]   33 y.o. Female  MRN: [id number]  Subjective:    Chief Complaint  Patient presents with   Annual Exam    Marchella Dosch is a 33 y.o. female who presents today for a complete physical exam. She reports consuming a general diet. The patient does not participate in regular exercise at present. She generally feels fairly well. She reports sleeping poorly. She does not have additional problems to discuss today.    Most recent fall risk assessment:    06/13/2024   11:58 AM  Fall Risk   Falls in the past year? 0  Number falls in past yr: 0  Injury with Fall? 0  Risk for fall due to : No Fall Risks  Follow up Falls evaluation completed     Most recent depression screenings:    06/13/2024   11:58 AM 06/01/2023   11:23 AM  PHQ 2/9 Scores  PHQ - 2 Score 5   PHQ- 9 Score 17   Exception Documentation  Patient refusal    Vision:Within last year and Dental: No current dental problems and Receives regular dental care    Patient Care Team: Willo Mini, NP as PCP - General (Nurse Practitioner)   Outpatient Medications Prior to Visit  Medication Sig   ibuprofen  (ADVIL ) 600 MG tablet Take 1 tablet (600 mg total) by mouth every 6 (six) hours as needed.   modafinil  (PROVIGIL ) 200 MG tablet Take 1 tablet (200 mg total) by mouth daily.   sertraline  (ZOLOFT ) 50 MG tablet TAKE 1 TABLET (50 MG TOTAL) BY MOUTH DAILY. NEEDS APPOINTMENT FOR FURTHER REFILLS.   [DISCONTINUED] folic acid (FOLVITE) 1 MG tablet Take 4 mg by mouth daily. (Patient not taking: Reported on 05/23/2024)   [DISCONTINUED] iron  polysaccharides (NIFEREX) 150 MG capsule Take 150 mg by mouth daily. (Patient not taking: Reported on 05/23/2024)   [DISCONTINUED] meloxicam  (MOBIC ) 15 MG tablet Take 15 mg by mouth daily. (Patient not taking: Reported on 05/23/2024)   [DISCONTINUED] Prenatal MV & Min w/FA-DHA (PRENATAL ADULT GUMMY/DHA/FA PO) Take 2 tablets by mouth  daily. (Patient not taking: Reported on 05/23/2024)   [DISCONTINUED] terconazole (TERAZOL 7) 0.4 % vaginal cream SMARTSIG:1 Vaginal Daily (Patient not taking: Reported on 05/23/2024)   No facility-administered medications prior to visit.   Review of Systems  Constitutional:  Positive for malaise/fatigue. Negative for chills, fever and weight loss.  HENT:  Negative for congestion, ear pain, hearing loss, sinus pain and sore throat.   Eyes:  Negative for blurred vision, photophobia and pain.  Respiratory:  Negative for cough, shortness of breath and wheezing.   Cardiovascular:  Negative for chest pain, palpitations and leg swelling.  Gastrointestinal:  Negative for abdominal pain, constipation, diarrhea, heartburn, nausea and vomiting.  Genitourinary:  Negative for dysuria, frequency and urgency.  Musculoskeletal:  Negative for falls and neck pain.  Skin:  Negative for itching and rash.  Neurological:  Negative for dizziness, weakness and headaches.  Endo/Heme/Allergies:  Negative for polydipsia. Does not bruise/bleed easily.  Psychiatric/Behavioral:  Negative for depression, substance abuse and suicidal ideas. The patient is nervous/anxious and has insomnia.      Objective:     BP 123/79 (BP Location: Left Arm, Cuff Size: Small)   Pulse 78   Resp 20   Wt 127 lb (57.6 kg)   SpO2 100%   BMI 24.00 kg/m    Physical Exam Constitutional:      General: She is  not in acute distress.    Appearance: Normal appearance. She is normal weight. She is not ill-appearing.  HENT:     Head: Normocephalic and atraumatic.     Right Ear: Tympanic membrane, ear canal and external ear normal. There is no impacted cerumen.     Left Ear: Tympanic membrane, ear canal and external ear normal. There is no impacted cerumen.     Nose: Nose normal. No congestion or rhinorrhea.     Mouth/Throat:     Mouth: Mucous membranes are moist.     Pharynx: No oropharyngeal exudate or posterior oropharyngeal erythema.   Eyes:     General: No scleral icterus.       Right eye: No discharge.        Left eye: No discharge.     Extraocular Movements: Extraocular movements intact.     Conjunctiva/sclera: Conjunctivae normal.     Pupils: Pupils are equal, round, and reactive to light.  Neck:     Thyroid: No thyromegaly.     Vascular: No carotid bruit or JVD.     Trachea: Trachea normal.  Cardiovascular:     Rate and Rhythm: Normal rate and regular rhythm.     Pulses: Normal pulses.     Heart sounds: Normal heart sounds. No murmur heard.    No friction rub. No gallop.  Pulmonary:     Effort: Pulmonary effort is normal. No respiratory distress.     Breath sounds: Normal breath sounds. No wheezing.  Abdominal:     General: Bowel sounds are normal. There is no distension.     Palpations: Abdomen is soft.     Tenderness: There is no abdominal tenderness. There is no guarding.  Musculoskeletal:        General: Normal range of motion.     Cervical back: Normal range of motion and neck supple.  Lymphadenopathy:     Cervical: No cervical adenopathy.  Skin:    General: Skin is warm and dry.  Neurological:     Mental Status: She is alert and oriented to person, place, and time.     Cranial Nerves: No cranial nerve deficit.  Psychiatric:        Mood and Affect: Mood normal.        Behavior: Behavior normal.        Thought Content: Thought content normal.        Judgment: Judgment normal.      No results found for any visits on 06/13/24.     Assessment & Plan:    Routine Health Maintenance and Physical Exam  Immunization History  Administered Date(s) Administered   Influenza Split 08/30/2013   PFIZER(Purple Top)SARS-COV-2 Vaccination 08/29/2020, 09/26/2020   Tdap 11/30/2007    Health Maintenance  Topic Date Due   Hepatitis B Vaccines (1 of 3 - 19+ 3-dose series) Never done   HPV VACCINES (1 - 3-dose SCDM series) Never done   DTaP/Tdap/Td (2 - Td or Tdap) 11/29/2017   Cervical Cancer  Screening (HPV/Pap Cotest)  06/29/2024   INFLUENZA VACCINE  06/29/2024   HIV Screening  Completed   Meningococcal B Vaccine  Aged Out   COVID-19 Vaccine  Discontinued   Hepatitis C Screening  Discontinued    Discussed health benefits of physical activity, and encouraged her to engage in regular exercise appropriate for her age and condition.  1. Annual physical exam (Primary) Checking labs as below. UTD on preventative care. Wellness information provided with AVS. - Lipid panel - CMP14+EGFR  2. Anxiety with depression Discontinue Zoloft  d/t excess sedation. Start Lexapro  5mg  daily for 6 weeks with close follow up in 4-6 weeks.   Return in about 6 weeks (around 07/25/2024) for mood follow up (ok to be virtual).     Cailan General, NP

## 2024-06-28 ENCOUNTER — Ambulatory Visit: Payer: Self-pay | Admitting: Medical-Surgical

## 2024-06-28 LAB — LIPID PANEL
Chol/HDL Ratio: 1.9 ratio (ref 0.0–4.4)
Cholesterol, Total: 126 mg/dL (ref 100–199)
HDL: 68 mg/dL (ref >39)
LDL Chol Calc (NIH): 36 mg/dL (ref 0–99)
Triglycerides: 129 mg/dL (ref 0–149)
VLDL Cholesterol Cal: 22 mg/dL (ref 5–40)

## 2024-06-28 LAB — CMP14+EGFR
ALT: 8 IU/L (ref 0–32)
AST: 9 IU/L (ref 0–40)
Albumin: 4 g/dL (ref 3.9–4.9)
Alkaline Phosphatase: 48 IU/L (ref 44–121)
BUN/Creatinine Ratio: 7 — ABNORMAL LOW (ref 9–23)
BUN: 5 mg/dL — ABNORMAL LOW (ref 6–20)
Bilirubin Total: 0.2 mg/dL (ref 0.0–1.2)
CO2: 18 mmol/L — ABNORMAL LOW (ref 20–29)
Calcium: 9 mg/dL (ref 8.7–10.2)
Chloride: 111 mmol/L — ABNORMAL HIGH (ref 96–106)
Creatinine, Ser: 0.73 mg/dL (ref 0.57–1.00)
Globulin, Total: 2.2 g/dL (ref 1.5–4.5)
Glucose: 122 mg/dL — ABNORMAL HIGH (ref 70–99)
Potassium: 3.7 mmol/L (ref 3.5–5.2)
Sodium: 142 mmol/L (ref 134–144)
Total Protein: 6.2 g/dL (ref 6.0–8.5)
eGFR: 111 mL/min/1.73 (ref >59)

## 2024-07-10 ENCOUNTER — Other Ambulatory Visit: Payer: Self-pay | Admitting: Medical-Surgical

## 2024-07-17 ENCOUNTER — Telehealth: Payer: Self-pay | Admitting: *Deleted

## 2024-07-17 NOTE — Telephone Encounter (Signed)
 Per [facility], unable to reach pt. She needs to call [facility] about high copay before they can ship Ocrevus  for her infusion.  Called pt at [phone]. She was on call with [facility], there is some confusion because she did enroll in copay assistance but it is not reflecting. I transferred call to [facility] to speak with pt.

## 2024-07-18 ENCOUNTER — Emergency Department (HOSPITAL_BASED_OUTPATIENT_CLINIC_OR_DEPARTMENT_OTHER)
Admission: EM | Admit: 2024-07-18 | Discharge: 2024-07-18 | Disposition: A | Discharge status: Home / Self Care | Attending: Emergency Medicine | Admitting: Emergency Medicine

## 2024-07-18 ENCOUNTER — Emergency Department (HOSPITAL_BASED_OUTPATIENT_CLINIC_OR_DEPARTMENT_OTHER)

## 2024-07-18 ENCOUNTER — Encounter (HOSPITAL_BASED_OUTPATIENT_CLINIC_OR_DEPARTMENT_OTHER): Payer: Self-pay | Admitting: Emergency Medicine

## 2024-07-18 ENCOUNTER — Other Ambulatory Visit: Payer: Self-pay

## 2024-07-18 DIAGNOSIS — R109 Unspecified abdominal pain: Secondary | ICD-10-CM

## 2024-07-18 DIAGNOSIS — R1013 Epigastric pain: Secondary | ICD-10-CM | POA: Diagnosis present

## 2024-07-18 DIAGNOSIS — R112 Nausea with vomiting, unspecified: Secondary | ICD-10-CM | POA: Diagnosis not present

## 2024-07-18 DIAGNOSIS — D72829 Elevated white blood cell count, unspecified: Secondary | ICD-10-CM | POA: Diagnosis not present

## 2024-07-18 DIAGNOSIS — R197 Diarrhea, unspecified: Secondary | ICD-10-CM | POA: Diagnosis not present

## 2024-07-18 LAB — URINALYSIS, ROUTINE W REFLEX MICROSCOPIC
Bilirubin Urine: NEGATIVE
Glucose, UA: NEGATIVE mg/dL
Hgb urine dipstick: NEGATIVE
Ketones, ur: 15 mg/dL — AB
Leukocytes,Ua: NEGATIVE
Nitrite: NEGATIVE
Protein, ur: NEGATIVE mg/dL
Specific Gravity, Urine: 1.02 (ref 1.005–1.030)
pH: 5.5 (ref 5.0–8.0)

## 2024-07-18 LAB — COMPREHENSIVE METABOLIC PANEL WITH GFR
ALT: 9 U/L (ref 0–44)
AST: 20 U/L (ref 15–41)
Albumin: 4.5 g/dL (ref 3.5–5.0)
Alkaline Phosphatase: 47 U/L (ref 38–126)
Anion gap: 13 (ref 5–15)
BUN: 6 mg/dL (ref 6–20)
CO2: 20 mmol/L — ABNORMAL LOW (ref 22–32)
Calcium: 9.2 mg/dL (ref 8.9–10.3)
Chloride: 111 mmol/L (ref 98–111)
Creatinine, Ser: 0.78 mg/dL (ref 0.44–1.00)
GFR, Estimated: >60 mL/min (ref >60)
Glucose, Bld: 105 mg/dL — ABNORMAL HIGH (ref 70–99)
Potassium: 4.4 mmol/L (ref 3.5–5.1)
Sodium: 143 mmol/L (ref 135–145)
Total Bilirubin: 0.3 mg/dL (ref 0.0–1.2)
Total Protein: 6.9 g/dL (ref 6.5–8.1)

## 2024-07-18 LAB — CBC
HCT: 39.5 % (ref 36.0–46.0)
Hemoglobin: 13.4 g/dL (ref 12.0–15.0)
MCH: 30 pg (ref 26.0–34.0)
MCHC: 33.9 g/dL (ref 30.0–36.0)
MCV: 88.6 fL (ref 80.0–100.0)
Platelets: 212 K/uL (ref 150–400)
RBC: 4.46 MIL/uL (ref 3.87–5.11)
RDW: 12.9 % (ref 11.5–15.5)
WBC: 16.3 K/uL — ABNORMAL HIGH (ref 4.0–10.5)
nRBC: 0 % (ref 0.0–0.2)

## 2024-07-18 LAB — LIPASE, BLOOD: Lipase: 21 U/L (ref 11–51)

## 2024-07-18 LAB — PREGNANCY, URINE: Preg Test, Ur: NEGATIVE

## 2024-07-18 MED ORDER — SODIUM CHLORIDE 0.9 % IV BOLUS
1000.0000 mL | Freq: Once | INTRAVENOUS | Status: AC
  Administered 2024-07-18: 1000 mL via INTRAVENOUS

## 2024-07-18 MED ORDER — ONDANSETRON HCL 4 MG/2ML IJ SOLN
4.0000 mg | Freq: Once | INTRAMUSCULAR | Status: AC
  Administered 2024-07-18: 4 mg via INTRAVENOUS
  Filled 2024-07-18: qty 2

## 2024-07-18 MED ORDER — MORPHINE SULFATE (PF) 4 MG/ML IV SOLN
4.0000 mg | Freq: Once | INTRAVENOUS | Status: AC
  Administered 2024-07-18: 4 mg via INTRAVENOUS
  Filled 2024-07-18: qty 1

## 2024-07-18 MED ORDER — ONDANSETRON HCL 4 MG PO TABS: 4.0000 mg | ORAL_TABLET | Freq: Four times a day (QID) | ORAL | 0 refills | Status: AC

## 2024-07-18 MED ORDER — IOHEXOL 300 MG/ML  SOLN
85.0000 mL | Freq: Once | INTRAMUSCULAR | Status: AC | PRN
  Administered 2024-07-18: 85 mL via INTRAVENOUS

## 2024-07-18 NOTE — ED Provider Notes (Signed)
 [place] [facility] Provider Note   CSN: [id number] Arrival date & time: 07/18/24  1408     Patient presents with: Abdominal Pain  HPI Barbara Barnett is a 33 y.o. female presenting for abdominal pain nausea vomiting and diarrhea.  Started at 10 AM this morning.  Pain is in the epigastric region and nonradiating.  Denies abnormal vaginal bleeding or discharge.  Denies urinary symptoms.  Does report that she used marijuana last night but no alcohol.  Reports history of appendectomy.  Past Medical History:  Diagnosis Date   Anxiety 34   Comes and goes on bad days i have anxiety attacks   Depression 2015   It comes and goes   HSV-2 infection    Multiple sclerosis (HCC)        Abdominal Pain      Prior to Admission medications   Medication Sig Start Date End Date Taking? Authorizing Provider  ondansetron  (ZOFRAN ) 4 MG tablet Take 1 tablet (4 mg total) by mouth every 6 (six) hours. 07/18/24  Yes Lang Norleen POUR, PA-C  escitalopram  (LEXAPRO ) 5 MG tablet Take 1 tablet (5 mg total) by mouth daily. 06/13/24   Willo Mini, NP  ibuprofen  (ADVIL ) 600 MG tablet Take 1 tablet (600 mg total) by mouth every 6 (six) hours as needed. 05/20/23   Rutherford Gain, MD  modafinil  (PROVIGIL ) 200 MG tablet Take 1 tablet (200 mg total) by mouth daily. 05/23/24   Sater, Charlie LABOR, MD  sertraline  (ZOLOFT ) 50 MG tablet TAKE 1 TABLET (50 MG TOTAL) BY MOUTH DAILY. NEEDS APPOINTMENT FOR FURTHER REFILLS. 02/02/24   Willo Mini, NP    Allergies: Patient has no known allergies.    Review of Systems  Gastrointestinal:  Positive for abdominal pain.    Updated Vital Signs BP (!) 141/93   Pulse 83   Temp 97.8 F (36.6 C)   Resp (!) 21   Ht 5' 1 (1.549 m)   Wt 57.6 kg   SpO2 100%   BMI 24.00 kg/m   Physical Exam Vitals and nursing note reviewed.  HENT:     Head: Normocephalic and atraumatic.     Mouth/Throat:     Mouth: Mucous membranes are moist.   Eyes:     General:        Right eye: No discharge.        Left eye: No discharge.     Conjunctiva/sclera: Conjunctivae normal.  Cardiovascular:     Rate and Rhythm: Normal rate and regular rhythm.     Pulses: Normal pulses.     Heart sounds: Normal heart sounds.  Pulmonary:     Effort: Pulmonary effort is normal.     Breath sounds: Normal breath sounds.  Abdominal:     General: Abdomen is flat.     Palpations: Abdomen is soft.     Tenderness: There is abdominal tenderness in the epigastric area.  Skin:    General: Skin is warm and dry.  Neurological:     General: No focal deficit present.  Psychiatric:        Mood and Affect: Mood normal.     (all labs ordered are listed, but only abnormal results are displayed) Labs Reviewed  COMPREHENSIVE METABOLIC PANEL WITH GFR - Abnormal; Notable for the following components:      Result Value   CO2 20 (*)    Glucose, Bld 105 (*)    All other components within normal limits  CBC - Abnormal;  Notable for the following components:   WBC 16.3 (*)    All other components within normal limits  URINALYSIS, ROUTINE W REFLEX MICROSCOPIC - Abnormal; Notable for the following components:   Ketones, ur 15 (*)    All other components within normal limits  LIPASE, BLOOD  PREGNANCY, URINE    EKG: EKG Interpretation Date/Time:  Wednesday July 18 2024 14:19:24 EDT Ventricular Rate:  63 PR Interval:  109 QRS Duration:  97 QT Interval:  384 QTC Calculation: 393 R Axis:   77  Text Interpretation: Sinus rhythm Short PR interval since last tracing no significant change Confirmed by Lenor Hollering [phone]) on 07/18/2024 4:08:33 PM  Radiology: CT ABDOMEN PELVIS W CONTRAST Result Date: 07/18/2024 CLINICAL DATA:  Nonlocalized acute abdominal pain. Nausea vomiting and diarrhea. EXAM: CT ABDOMEN AND PELVIS WITH CONTRAST TECHNIQUE: Multidetector CT imaging of the abdomen and pelvis was performed using the standard protocol following bolus  administration of intravenous contrast. RADIATION DOSE REDUCTION: This exam was performed according to the departmental dose-optimization program which includes automated exposure control, adjustment of the mA and/or kV according to patient size and/or use of iterative reconstruction technique. CONTRAST:  85mL OMNIPAQUE  IOHEXOL  300 MG/ML  SOLN COMPARISON:  05/03/2020 FINDINGS: Lower chest: No acute findings. Hepatobiliary: No suspicious focal abnormality within the liver parenchyma. Small area of low attenuation in the anterior liver, adjacent to the falciform ligament, is in a characteristic location for focal fatty deposition. There is no evidence for gallstones, gallbladder wall thickening, or pericholecystic fluid. No intrahepatic or extrahepatic biliary dilation. Pancreas: No focal mass lesion. No dilatation of the main duct. No intraparenchymal cyst. No peripancreatic edema. Spleen: No splenomegaly. No suspicious focal mass lesion. Adrenals/Urinary Tract: No adrenal nodule or mass. Kidneys unremarkable. No evidence for hydroureter. Prominence of the bladder wall likely accentuated by underdistention. Stomach/Bowel: Stomach is unremarkable. No gastric wall thickening. No evidence of outlet obstruction. Duodenum is normally positioned as is the ligament of Treitz. No small bowel dilatation. There are some small bowel loops in the pelvis (see axial image 54 series 2) that appear to have circumferential wall thickening. Ill-defined wall thickening is visible in the terminal ileum (54/2). Nonvisualization of the appendix is consistent with the reported history of appendectomy. Colon is decompressed, accentuating wall thickness. No substantial pericolonic edema or inflammation. Fluid is seen in the rectum, a finding that can be associated with clinical diarrhea. Vascular/Lymphatic: No abdominal aortic aneurysm. No abdominal aortic atherosclerotic calcification. There is no gastrohepatic or hepatoduodenal ligament  lymphadenopathy. No retroperitoneal or mesenteric lymphadenopathy. No pelvic sidewall lymphadenopathy. Reproductive: The uterus is unremarkable. There is no adnexal mass. Diaphragm or hormone ring identified in the vagina. Other: Trace free fluid is seen in the pelvis. Musculoskeletal: No worrisome lytic or sclerotic osseous abnormality. IMPRESSION: 1. There are some small bowel loops in the pelvis that appear to have circumferential wall thickening. Ill-defined wall thickening is visible in the terminal ileum. Findings may reflect underdistention although infectious/inflammatory enteritis is a consideration. 2. Fluid is seen in the rectum, a finding that can be associated with clinical diarrhea. 3. Prominence of the bladder wall likely accentuated by underdistention. Correlation with urinalysis recommended to exclude cystitis. Electronically Signed   By: Camellia Candle M.D.   On: 07/18/2024 15:50     Procedures   Medications Ordered in the ED  sodium chloride  0.9 % bolus 1,000 mL (1,000 mLs Intravenous New Bag/Given 07/18/24 1510)  morphine  (PF) 4 MG/ML injection 4 mg (4 mg Intravenous Given 07/18/24 1512)  ondansetron  (ZOFRAN ) injection 4 mg (4 mg Intravenous Given 07/18/24 1511)  iohexol  (OMNIPAQUE ) 300 MG/ML solution 85 mL (85 mLs Intravenous Contrast Given 07/18/24 1525)                                    Medical Decision Making Amount and/or Complexity of Data Reviewed Labs: ordered. Radiology: ordered.  Risk Prescription drug management.   Initial Impression and Ddx 33 year old well-appearing female presenting for abdominal pain nausea vomiting diarrhea.  Exam notable for epigastric tenderness.  DDx includes acute pancreatitis, CHS, acute cholecystitis, kidney stone, ACS, electrolyte derangement, other. Patient PMH that increases complexity of ED encounter:  MS  Interpretation of Diagnostics - I independent reviewed and interpreted the labs as followed: Leukocytosis  - I  independently visualized the following imaging with scope of interpretation limited to determining acute life threatening conditions related to emergency care: CT ab/pel, which revealed  There are some small bowel loops in the pelvis that appear to have circumferential wall thickening. Ill-defined wall thickening is visible in the terminal ileum. Findings may reflect underdistention although infectious/inflammatory enteritis is a consideration. 2. Fluid is seen in the rectum, a finding that can be associated with clinical diarrhea. 3. Prominence of the bladder wall likely accentuated by underdistention.  - I personally reviewed and interpreted EKG which revealed sinus rhythm  Patient Reassessment and Ultimate Disposition/Management On reassessment, patient states she was feeling better and expressed desire to go home.  Workup reassuring.  Considered gallbladder pathology but unlikely given normal enzymes no right upper quadrant tenderness and reassuring CT scan.  Sent Zofran  to her pharmacy.  Advised to follow-up with her PCP.  Suspect this is likely CHS.  Discussed return precautions.  Fluid challenge with no issue.  Discharged in good condition.  Patient management required discussion with the following services or consulting groups:  None  Complexity of Problems Addressed Acute complicated illness or Injury  Additional Data Reviewed and Analyzed Further history obtained from: Past medical history and medications listed in the EMR and Prior ED visit notes  Patient Encounter Risk Assessment Consideration of hospitalization      Final diagnoses:  Abdominal pain, unspecified abdominal location    ED Discharge Orders          Ordered    ondansetron  (ZOFRAN ) 4 MG tablet  Every 6 hours        07/18/24 1619               Lang Norleen POUR, PA-C 07/18/24 1621    Emil Share, DO 07/20/24 1158

## 2024-07-18 NOTE — ED Notes (Signed)
Patient transported to CT and back without event.

## 2024-07-18 NOTE — ED Triage Notes (Signed)
 Pt c/o epigastric abd pain since 1000 today, nausea, vomiting, diarrhea.  Denies known fever, known sick contacts.   Took nausea meds at [facility].

## 2024-07-18 NOTE — Discharge Instructions (Addendum)
 Evaluation today was overall reassuring. Sent Zofran  to your pharmacy for nausea and vomiting.  Please continue hydration at home.  Please follow-up your PCP.  If symptoms worsen please return to the ED for further evaluation.

## 2024-07-31 ENCOUNTER — Encounter: Payer: Self-pay | Admitting: Sports Medicine

## 2024-08-17 ENCOUNTER — Other Ambulatory Visit: Payer: Self-pay | Admitting: Medical-Surgical

## 2024-12-19 ENCOUNTER — Encounter: Payer: Self-pay | Admitting: Neurology

## 2024-12-19 ENCOUNTER — Ambulatory Visit: Admitting: Neurology

## 2024-12-19 VITALS — BP 134/89 | HR 90 | Ht 60.0 in | Wt 136.0 lb

## 2024-12-19 DIAGNOSIS — R208 Other disturbances of skin sensation: Secondary | ICD-10-CM | POA: Diagnosis not present

## 2024-12-19 DIAGNOSIS — F988 Other specified behavioral and emotional disorders with onset usually occurring in childhood and adolescence: Secondary | ICD-10-CM

## 2024-12-19 DIAGNOSIS — G35A Relapsing-remitting multiple sclerosis: Secondary | ICD-10-CM

## 2024-12-19 DIAGNOSIS — Z79899 Other long term (current) drug therapy: Secondary | ICD-10-CM

## 2024-12-19 DIAGNOSIS — R269 Unspecified abnormalities of gait and mobility: Secondary | ICD-10-CM

## 2024-12-19 DIAGNOSIS — R5383 Other fatigue: Secondary | ICD-10-CM

## 2024-12-19 NOTE — Progress Notes (Signed)
 "  [facility]  PATIENT: Barbara Barnett DOB: 24-Dec-1990  REFERRING DOCTOR OR PCP:  Norleen Binder, DPM SOURCE: Patient, notes from podiatry, notes from emergency room visit.  Laboratory and imaging results reviewed.  CT images personally reviewed.  _________________________________   HISTORICAL  CHIEF COMPLAINT:  Chief Complaint  Patient presents with   Follow-up    Room 11 Alone    HISTORY OF PRESENT ILLNESS:  Barbara Barnett is a 34 y.o. woman with relapsing remitting MS  Update 12/20/2023 She is on Ocrevus  and next infusion is March 2026 .  She had stopped after pregnancy and delivery (boy) May 18, 2023.     She tolerated Ocrevus  well.   While on it she had no exacerbation    MRI 05/2023 showed no progression during her pregnancy    She does infusions in the office.    She is walking well and keeps up with others.   She has no trouble with stairs .  No recent fall.  She is noting some tingling in her hands and feet.   This seems worse the past 6 months.  She used to just have at the end of each 6 month Ocrevus  cycle but now more persistent (though still fluctuating.   Strength an coordination are fine.   In the past she had been on gabaoenti but she felt lightheaded and stopped.   Bladder function is fine.    Vision is good (wears glasses).   .    She feels more forgetful and sometimes has word finding issues.   This is similar to last year.  Modafiil had not helped.   She was never diagnosed ith ADD in past.   However she feels focus and attention are reduced.   She has a lot of thoughts at night and sometimes has trouble falling asleep.   She denies depression  Data: Imaging: MRI brain 06/25/2023 showed Multiple supratentorial and infratentorial foci in a pattern consistent with chronic demyelinating plaque associated with multiple sclerosis. None of the foci enhanced or appear to be acute. Compared to the MRI from 01/06/2022, there were no new lesions.   MRI  cervical spine showed multiple T2 hyperintense foci. These are located posterocentrally adjacent to C3, posterior centrally adjacent to T1-T2. None of the foci enhance after contrast. Both of these foci were present on the previous 01/06/2022 MRI and there are no new lesions noted.   MRI of the brain 01/01/2021 shows multiple T2/FLAIR hyperintense foci in the hemispheres and brainstem consistent with MS.  MAny of the foci in the hemispheres and one at the pontomedullary junction enhanced after contrast.  Some of the enhancing foci in the hemispheres had mass-effect.  MRI of the cervical spine 01/01/2021 showed T2 hyperintense foci at C5-C6 and T1-T2.  They do not enhance.  MRI of the thoracic spine 01/05/2021 showed hyperintense foci within the spinal cord adjacent to T1-T2, T3, T6-T7 and T7-T8.  There is subtle enhancement of the focus adjacent to T7-T8..    Laboratory: CBC 11/08/2020 was normal.  BMP 11/08/2020 shows low potassium (2.8)  REVIEW OF SYSTEMS: Constitutional: No fevers, chills, sweats, or change in appetite Eyes: No visual changes, double vision, eye pain Ear, nose and throat: No hearing loss, ear pain, nasal congestion, sore throat Cardiovascular: No chest pain, palpitations Respiratory:  No shortness of breath at rest or with exertion.   No wheezes GastrointestinaI: No nausea, vomiting, diarrhea, abdominal pain, fecal incontinence Genitourinary:  No dysuria, urinary retention or frequency.  No nocturia. Musculoskeletal:  No neck pain, back pain Integumentary: No rash, pruritus, skin lesions Neurological: as above Psychiatric: No depression at this time.  No anxiety Endocrine: No palpitations, diaphoresis, change in appetite, change in weigh or increased thirst Hematologic/Lymphatic:  No anemia, purpura, petechiae. Allergic/Immunologic: No itchy/runny eyes, nasal congestion, recent allergic reactions, rashes  ALLERGIES: No Known Allergies  HOME MEDICATIONS:  Current Outpatient  Medications:    ibuprofen  (ADVIL ) 600 MG tablet, Take 1 tablet (600 mg total) by mouth every 6 (six) hours as needed., Disp: 30 tablet, Rfl: 3   escitalopram  (LEXAPRO ) 5 MG tablet, Take 1 tablet (5 mg total) by mouth daily. NEEDS APPOINTMENT FOR FURTHER REFILLS. (Patient not taking: Reported on 12/19/2024), Disp: 30 tablet, Rfl: 0   modafinil  (PROVIGIL ) 200 MG tablet, Take 1 tablet (200 mg total) by mouth daily. (Patient not taking: Reported on 12/19/2024), Disp: 30 tablet, Rfl: 5   ondansetron  (ZOFRAN ) 4 MG tablet, Take 1 tablet (4 mg total) by mouth every 6 (six) hours. (Patient not taking: Reported on 12/19/2024), Disp: 12 tablet, Rfl: 0   sertraline  (ZOLOFT ) 50 MG tablet, TAKE 1 TABLET (50 MG TOTAL) BY MOUTH DAILY. NEEDS APPOINTMENT FOR FURTHER REFILLS. (Patient not taking: Reported on 12/19/2024), Disp: 7 tablet, Rfl: 0  PAST MEDICAL HISTORY: Past Medical History:  Diagnosis Date   Anxiety 34   Comes and goes on bad days i have anxiety attacks   Depression 2015   It comes and goes   HSV-2 infection    Multiple sclerosis     PAST SURGICAL HISTORY: Past Surgical History:  Procedure Laterality Date   APPENDECTOMY     LAPAROSCOPIC APPENDECTOMY N/A 04/28/2020   Procedure: APPENDECTOMY LAPAROSCOPIC;  Surgeon: Belinda Cough, MD;  Location: [facility] OR;  Service: General;  Laterality: N/A;    FAMILY HISTORY: Family History  Problem Relation Age of Onset   Healthy Mother    Diabetes type II Father    Stroke Father    Hypertension Father    Depression Father    Diabetes Father    Diabetes Paternal Uncle    Hypertension Paternal Uncle     SOCIAL HISTORY:  Social History   Socioeconomic History   Marital status: Single    Spouse name: Not on file   Number of children: 1   Years of education: BA   Highest education level: Bachelor's degree (e.g., BA, AB, BS)  Occupational History   Occupation: Community Education Officer  Tobacco Use   Smoking status: Former    Types: E-cigarettes   Smokeless  tobacco: Never   Tobacco comments:    1 cigar/socially (usually when she drinks)  Vaping Use   Vaping status: Former  Substance and Sexual Activity   Alcohol use: Not Currently    Alcohol/week: 6.0 standard drinks of alcohol    Types: 6 Shots of liquor per week    Comment: 3 drinks per weekend   Drug use: Never   Sexual activity: Yes    Birth control/protection: Condom, Inserts  Other Topics Concern   Not on file  Social History Narrative   Right handed    Lives alone with her son (6 y/o)-12/18/20   Caffeine use: none   Social Drivers of Health   Tobacco Use: Medium Risk (12/19/2024)   Patient History    Smoking Tobacco Use: Former    Smokeless Tobacco Use: Never    Passive Exposure: Not on file  Financial Resource Strain: Patient Declined (06/01/2023)   Overall Financial Resource Strain (CARDIA)  Difficulty of Paying Living Expenses: Patient declined  Food Insecurity: Patient Declined (06/01/2023)   Hunger Vital Sign    Worried About Running Out of Food in the Last Year: Patient declined    Ran Out of Food in the Last Year: Patient declined  Transportation Needs: Patient Declined (06/01/2023)   PRAPARE - Administrator, Civil Service (Medical): Patient declined    Lack of Transportation (Non-Medical): Patient declined  Physical Activity: Patient Declined (06/01/2023)   Exercise Vital Sign    Days of Exercise per Week: Patient declined    Minutes of Exercise per Session: Patient declined  Stress: Patient Declined (06/01/2023)   [employer] of Occupational Health - Occupational Stress Questionnaire    Feeling of Stress : Patient declined  Social Connections: Patient Declined (06/01/2023)   Social Connection and Isolation Panel    Frequency of Communication with Friends and Family: Patient declined    Frequency of Social Gatherings with Friends and Family: Patient declined    Attends Religious Services: Patient declined    Active Member of Clubs or Organizations:  Patient declined    Attends Banker Meetings: Patient declined    Marital Status: Patient declined  Intimate Partner Violence: Patient Declined (06/01/2023)   Humiliation, Afraid, Rape, and Kick questionnaire    Fear of Current or Ex-Partner: Patient declined    Emotionally Abused: Patient declined    Physically Abused: Patient declined    Sexually Abused: Patient declined  Depression (PHQ2-9): High Risk (06/13/2024)   Depression (PHQ2-9)    PHQ-2 Score: 17  Alcohol Screen: Low Risk (02/17/2023)   Alcohol Screen    Last Alcohol Screening Score (AUDIT): 7  Housing: Patient Declined (06/01/2023)   Housing    Last Housing Risk Score: 98  Utilities: Patient Declined (06/01/2023)   AHC Utilities    Threatened with loss of utilities: Patient declined  Health Literacy: Not on file     PHYSICAL EXAM  Vitals:   12/19/24 1418  BP: 134/89  Pulse: 90  SpO2: 98%  Weight: 136 lb (61.7 kg)  Height: 5' (1.524 m)    Body mass index is 26.56 kg/m.   General: The patient is well-developed and well-nourished and in no acute distress  HEENT:  Head is [place]/AT.  Sclera are anicteric.    Neck:   The neck is nontender.  Skin: Extremities are without rash or  edema.  Neurologic Exam  Mental status: The patient is alert and oriented x 3 at the time of the examination. The patient has apparent normal recent and remote memory, with an apparently normal attention span and concentration ability.   Speech is normal.  Cranial nerves: Extraocular movements are full. Normal facial strength and sensation  .  Speech was normal.  No hearing deficits noted Motor:  Muscle bulk is normal.   Tone is normal. Strength is  5 / 5 in all 4 extremities.   Sensory: Sensory testing shows normal vibration and touch sensation in arms and legs  Coordination: Cerebellar testing reveals good finger-nose-finger and heel to shin    Gait and station: Station is normal.   She has a normal gait .  The tandem gait  was wide.  Romberg is negative.  Reflexes: Deep tendon reflexes are symmetric and normal in arms and legs.   No ankle clonus     DIAGNOSTIC DATA (LABS, IMAGING, TESTING) - I reviewed patient records, labs, notes, testing and imaging myself where available.  Lab Results  Component Value Date  WBC 16.3 (H) 07/18/2024   HGB 13.4 07/18/2024   HCT 39.5 07/18/2024   MCV 88.6 07/18/2024   PLT 212 07/18/2024      Component Value Date/Time   NA 143 07/18/2024 1418   NA 142 06/27/2024 1604   K 4.4 07/18/2024 1418   CL 111 07/18/2024 1418   CO2 20 (L) 07/18/2024 1418   GLUCOSE 105 (H) 07/18/2024 1418   BUN 6 07/18/2024 1418   BUN 5 (L) 06/27/2024 1604   CREATININE 0.78 07/18/2024 1418   CREATININE 0.76 12/17/2021 0000   CALCIUM  9.2 07/18/2024 1418   PROT 6.9 07/18/2024 1418   PROT 6.2 06/27/2024 1604   ALBUMIN  4.5 07/18/2024 1418   ALBUMIN  4.0 06/27/2024 1604   AST 20 07/18/2024 1418   ALT 9 07/18/2024 1418   ALKPHOS 47 07/18/2024 1418   BILITOT 0.3 07/18/2024 1418   BILITOT 0.2 06/27/2024 1604   GFRNONAA >60 07/18/2024 1418   GFRAA 114 01/05/2021 1551       ASSESSMENT AND PLAN  Multiple sclerosis, relapsing-remitting  High risk medication use  Attention deficit disorder, unspecified type  Dysesthesia  Gait disturbance  Other fatigue   1.   Continue Ocrevus .  We will check MRI of the brain to determine if there is subclinical progression.  She has had some additional sensory symptoms.  Will also check IgG/IgM.  IgG has been low in the past. 2.  Stay active and exercise as tolerated.  If attention/recall worsens, consider a stimulant for fatigue and attention 3.  She will return to see me in 6 months or sooner if there are new or worsening neurologic symptoms.    Burnie Hank A. Vear, MD, [facility] 12/19/2024, 2:36 PM Certified in Neurology, Clinical Neurophysiology, Sleep Medicine and Neuroimaging  [facility] [address] [place], [place] [address] [phone] "

## 2024-12-20 ENCOUNTER — Ambulatory Visit: Payer: Self-pay | Admitting: Neurology

## 2024-12-20 LAB — CBC WITH DIFFERENTIAL/PLATELET
Basophils Absolute: 0 x10E3/uL (ref 0.0–0.2)
Basos: 0 %
EOS (ABSOLUTE): 0 x10E3/uL (ref 0.0–0.4)
Eos: 1 %
Hematocrit: 39.1 % (ref 34.0–46.6)
Hemoglobin: 13 g/dL (ref 11.1–15.9)
Immature Grans (Abs): 0 x10E3/uL (ref 0.0–0.1)
Immature Granulocytes: 0 %
Lymphocytes Absolute: 1.3 x10E3/uL (ref 0.7–3.1)
Lymphs: 22 %
MCH: 29.3 pg (ref 26.6–33.0)
MCHC: 33.2 g/dL (ref 31.5–35.7)
MCV: 88 fL (ref 79–97)
Monocytes Absolute: 0.7 x10E3/uL (ref 0.1–0.9)
Monocytes: 11 %
Neutrophils Absolute: 4 x10E3/uL (ref 1.4–7.0)
Neutrophils: 66 %
Platelets: 322 x10E3/uL (ref 150–450)
RBC: 4.43 x10E6/uL (ref 3.77–5.28)
RDW: 13.2 % (ref 11.7–15.4)
WBC: 6 x10E3/uL (ref 3.4–10.8)

## 2024-12-20 LAB — IGG, IGA, IGM
IgG (Immunoglobin G), Serum: 519 mg/dL — ABNORMAL LOW (ref 586–1602)
IgM (Immunoglobulin M), Srm: 140 mg/dL (ref 26–217)
Immunoglobulin A, (IgA) QN, Serum: 155 mg/dL (ref 87–352)

## 2024-12-25 ENCOUNTER — Ambulatory Visit

## 2024-12-25 DIAGNOSIS — R269 Unspecified abnormalities of gait and mobility: Secondary | ICD-10-CM | POA: Diagnosis not present

## 2024-12-25 DIAGNOSIS — G35A Relapsing-remitting multiple sclerosis: Secondary | ICD-10-CM

## 2024-12-25 DIAGNOSIS — R208 Other disturbances of skin sensation: Secondary | ICD-10-CM

## 2024-12-25 MED ORDER — GADOBENATE DIMEGLUMINE 529 MG/ML IV SOLN
12.0000 mL | Freq: Once | INTRAVENOUS | Status: AC | PRN
  Administered 2024-12-25: 12 mL via INTRAVENOUS

## 2025-08-21 ENCOUNTER — Ambulatory Visit: Admitting: Neurology
